# Patient Record
Sex: Female | Born: 1985 | Hispanic: No | Marital: Single | Smoking: Current every day smoker
Health system: Southern US, Community
[De-identification: ages and names within clinical notes are randomized; demographics above are authoritative.]

## PROBLEM LIST (undated history)

## (undated) DIAGNOSIS — J45909 Unspecified asthma, uncomplicated: Secondary | ICD-10-CM

## (undated) DIAGNOSIS — I8390 Asymptomatic varicose veins of unspecified lower extremity: Secondary | ICD-10-CM

## (undated) HISTORY — PX: BACK SURGERY: SHX140

## (undated) HISTORY — DX: Asymptomatic varicose veins of unspecified lower extremity: I83.90

## (undated) HISTORY — PX: OTHER SURGICAL HISTORY: SHX169

---
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
## 2007-12-22 ENCOUNTER — Inpatient Hospital Stay (HOSPITAL_COMMUNITY): Admission: AD | Admit: 2007-12-22 | Discharge: 2007-12-22 | Payer: Self-pay | Admitting: Obstetrics & Gynecology

## 2008-07-25 ENCOUNTER — Emergency Department (HOSPITAL_BASED_OUTPATIENT_CLINIC_OR_DEPARTMENT_OTHER): Admission: EM | Admit: 2008-07-25 | Discharge: 2008-07-25 | Payer: Self-pay | Admitting: Emergency Medicine

## 2009-08-10 ENCOUNTER — Emergency Department (HOSPITAL_BASED_OUTPATIENT_CLINIC_OR_DEPARTMENT_OTHER): Admission: EM | Admit: 2009-08-10 | Discharge: 2009-08-11 | Payer: Self-pay | Admitting: Emergency Medicine

## 2009-08-13 ENCOUNTER — Emergency Department (HOSPITAL_BASED_OUTPATIENT_CLINIC_OR_DEPARTMENT_OTHER): Admission: EM | Admit: 2009-08-13 | Discharge: 2009-08-13 | Payer: Self-pay | Admitting: Emergency Medicine

## 2010-03-03 ENCOUNTER — Emergency Department (HOSPITAL_BASED_OUTPATIENT_CLINIC_OR_DEPARTMENT_OTHER): Admission: EM | Admit: 2010-03-03 | Discharge: 2010-03-04 | Payer: Self-pay | Admitting: Emergency Medicine

## 2010-10-22 ENCOUNTER — Emergency Department (INDEPENDENT_AMBULATORY_CARE_PROVIDER_SITE_OTHER)
Admission: EM | Admit: 2010-10-22 | Discharge: 2010-10-22 | Payer: Self-pay | Source: Home / Self Care | Admitting: Emergency Medicine

## 2010-10-22 DIAGNOSIS — X500XXA Overexertion from strenuous movement or load, initial encounter: Secondary | ICD-10-CM

## 2010-10-22 DIAGNOSIS — M25519 Pain in unspecified shoulder: Secondary | ICD-10-CM

## 2011-05-13 ENCOUNTER — Emergency Department (HOSPITAL_BASED_OUTPATIENT_CLINIC_OR_DEPARTMENT_OTHER)
Admission: EM | Admit: 2011-05-13 | Discharge: 2011-05-13 | Discharge status: Against Medical Advice | Payer: Self-pay | Attending: Emergency Medicine | Admitting: Emergency Medicine

## 2011-05-13 ENCOUNTER — Emergency Department (INDEPENDENT_AMBULATORY_CARE_PROVIDER_SITE_OTHER): Payer: Self-pay

## 2011-05-13 ENCOUNTER — Encounter: Payer: Self-pay | Admitting: *Deleted

## 2011-05-13 DIAGNOSIS — Y9239 Other specified sports and athletic area as the place of occurrence of the external cause: Secondary | ICD-10-CM | POA: Insufficient documentation

## 2011-05-13 DIAGNOSIS — S62639A Displaced fracture of distal phalanx of unspecified finger, initial encounter for closed fracture: Secondary | ICD-10-CM | POA: Insufficient documentation

## 2011-05-13 DIAGNOSIS — X58XXXA Exposure to other specified factors, initial encounter: Secondary | ICD-10-CM | POA: Insufficient documentation

## 2011-05-13 DIAGNOSIS — B081 Molluscum contagiosum: Secondary | ICD-10-CM

## 2011-05-13 DIAGNOSIS — Y9361 Activity, american tackle football: Secondary | ICD-10-CM | POA: Insufficient documentation

## 2011-05-13 MED ORDER — HYDROCODONE-ACETAMINOPHEN 5-500 MG PO TABS: 1.0000 | ORAL_TABLET | Freq: Four times a day (QID) | ORAL | Status: AC | PRN

## 2011-05-13 NOTE — ED Provider Notes (Signed)
Doug Sou, MD Medical screening examination/treatment/procedure(s) were conducted as a shared visit with non-physician practitioner(s) and myself.  I personally evaluated the patient during the encounter  Doug Sou, MD 05/13/11 1439

## 2011-05-13 NOTE — ED Provider Notes (Signed)
History     CSN: [id number] Arrival date & time: 05/13/2011  1:19 PM  Chief Complaint  Patient presents with  . Hand Pain   HPI Comments: Pt states that she was playing football yesterday and that is when the pain started:pt states that she has also noted an bump to her vaginal area about 2 weeks ago that she wants to have looked at:pt state that there is no pain associated with and there has not been any drainage  Patient is a 25 y.o. female presenting with hand pain. The history is provided by the patient. No language interpreter was used.  Hand Pain This is a new problem. The current episode started yesterday. The problem occurs constantly. The problem has been unchanged. The symptoms are aggravated by bending. She has tried nothing for the symptoms.    History reviewed. No pertinent past medical history.  No past surgical history on file.  No family history on file.  History  Substance Use Topics  . Smoking status: Former Smoker -- 0.5 packs/day for 5 years  . Smokeless tobacco: Current User  . Alcohol Use: Yes    OB History    Grav Para Term Preterm Abortions TAB SAB Ect Mult Living                  Review of Systems  All other systems reviewed and are negative.    Physical Exam  BP 113/63  Pulse 75  Temp(Src) 98 F (36.7 C) (Oral)  Resp 18  Ht 5\' 9"  (1.753 m)  Wt 215 lb (97.523 kg)  BMI 31.75 kg/m2  SpO2 100%  LMP 04/27/2011  Physical Exam  Nursing note and vitals reviewed. Constitutional: She appears well-developed and well-nourished.  HENT:  Head: Normocephalic.  Eyes: Pupils are equal, round, and reactive to light.  Neck: Normal range of motion.  Cardiovascular: Normal rate and regular rhythm.   Pulmonary/Chest: Effort normal and breath sounds normal.  Genitourinary:       Pt has a small raised area to the right labia that has a cauliflower appearance to the area  Musculoskeletal: Normal range of motion.  Neurological: She is alert.  Skin:     Pt has mild redness noted to the distal aspect of the right middle finger:pt has full rom:pt is tender from along the middle and distal phalanx  Psychiatric: She has a normal mood and affect.    ED Course  Procedures Dg Finger Middle Right  05/13/2011  *RADIOLOGY REPORT*  Clinical Data: Injured right long finger.  Pain and swelling distally.  RIGHT MIDDLE FINGER 2+V 05/13/2011:  Comparison: None.  Findings: Mildly displaced fracture involving the base of the distal phalanx, extending to the articular surface.  No other fractures.  Well-preserved joint spaces.  Well-preserved bone mineral density.  IMPRESSION: Intra-articular mildly displaced fracture involving the base of the distal phalanx.  Original Report Authenticated By: Arnell Sieving, M.D.    MDM Vaginal bump consistent with vaginal warts:pt finger splinted by nursing staff:pt okay to go home with something for pain and follow up with gyn and ortho      Teressa Lower, NP 05/13/11 1430

## 2011-05-13 NOTE — ED Notes (Signed)
Pt. Has redness at the fingernail bed on the R Middle finger and reports a white spot on her R labia she wants checked.  NO drainage from either site.

## 2011-06-05 NOTE — ED Provider Notes (Signed)
Medical screening examination/treatment/procedure(s) were performed by non-physician practitioner and as supervising physician I was immediately available for consultation/collaboration.  Doug Sou, MD 06/05/11 1620

## 2011-06-20 LAB — URINALYSIS, ROUTINE W REFLEX MICROSCOPIC
Glucose, UA: NEGATIVE
Hgb urine dipstick: NEGATIVE
Ketones, ur: NEGATIVE
Nitrite: NEGATIVE
pH: 7.5

## 2011-06-20 LAB — URINE MICROSCOPIC-ADD ON

## 2011-06-20 LAB — POCT PREGNANCY, URINE
Operator id: 263291
Preg Test, Ur: POSITIVE

## 2011-06-26 ENCOUNTER — Emergency Department (HOSPITAL_COMMUNITY)
Admission: EM | Admit: 2011-06-26 | Discharge: 2011-06-26 | Disposition: A | Discharge status: Home / Self Care | Payer: Self-pay | Attending: Emergency Medicine | Admitting: Emergency Medicine

## 2011-06-26 ENCOUNTER — Emergency Department (HOSPITAL_COMMUNITY): Payer: Self-pay

## 2011-06-26 DIAGNOSIS — R079 Chest pain, unspecified: Secondary | ICD-10-CM | POA: Insufficient documentation

## 2011-06-26 DIAGNOSIS — R05 Cough: Secondary | ICD-10-CM | POA: Insufficient documentation

## 2011-06-26 DIAGNOSIS — R059 Cough, unspecified: Secondary | ICD-10-CM | POA: Insufficient documentation

## 2011-06-26 DIAGNOSIS — R51 Headache: Secondary | ICD-10-CM | POA: Insufficient documentation

## 2011-06-26 DIAGNOSIS — R209 Unspecified disturbances of skin sensation: Secondary | ICD-10-CM | POA: Insufficient documentation

## 2011-06-26 DIAGNOSIS — R61 Generalized hyperhidrosis: Secondary | ICD-10-CM | POA: Insufficient documentation

## 2011-06-26 LAB — CBC
MCH: 33.1 pg (ref 26.0–34.0)
MCHC: 34.8 g/dL (ref 30.0–36.0)
RDW: 13.6 % (ref 11.5–15.5)

## 2011-06-26 LAB — URINALYSIS, ROUTINE W REFLEX MICROSCOPIC
Glucose, UA: NEGATIVE mg/dL
Hgb urine dipstick: NEGATIVE
Protein, ur: NEGATIVE mg/dL
pH: 7 (ref 5.0–8.0)

## 2011-06-26 LAB — CK TOTAL AND CKMB (NOT AT ARMC)
CK, MB: 2.5 ng/mL (ref 0.3–4.0)
Relative Index: 0.9 (ref 0.0–2.5)

## 2011-06-26 LAB — DIFFERENTIAL
Basophils Absolute: 0.1 10*3/uL (ref 0.0–0.1)
Basophils Relative: 1 % (ref 0–1)
Eosinophils Relative: 1 % (ref 0–5)
Monocytes Absolute: 1 10*3/uL (ref 0.1–1.0)
Monocytes Relative: 8 % (ref 3–12)

## 2011-06-26 LAB — BASIC METABOLIC PANEL
BUN: 7 mg/dL (ref 6–23)
Calcium: 9.7 mg/dL (ref 8.4–10.5)
GFR calc Af Amer: >60 mL/min (ref >60)
GFR calc non Af Amer: >60 mL/min (ref >60)
Potassium: 3.9 mEq/L (ref 3.5–5.1)
Sodium: 137 mEq/L (ref 135–145)

## 2011-06-26 LAB — POCT I-STAT TROPONIN I: Troponin i, poc: 0 ng/mL (ref 0.00–0.08)

## 2011-06-26 LAB — URINE MICROSCOPIC-ADD ON

## 2012-03-29 ENCOUNTER — Emergency Department (HOSPITAL_BASED_OUTPATIENT_CLINIC_OR_DEPARTMENT_OTHER)
Admission: EM | Admit: 2012-03-29 | Discharge: 2012-03-29 | Disposition: A | Discharge status: Home / Self Care | Payer: Medicaid Other | Attending: Emergency Medicine | Admitting: Emergency Medicine

## 2012-03-29 ENCOUNTER — Encounter (HOSPITAL_BASED_OUTPATIENT_CLINIC_OR_DEPARTMENT_OTHER): Payer: Self-pay | Admitting: *Deleted

## 2012-03-29 ENCOUNTER — Emergency Department (HOSPITAL_BASED_OUTPATIENT_CLINIC_OR_DEPARTMENT_OTHER): Payer: Medicaid Other

## 2012-03-29 DIAGNOSIS — S63501A Unspecified sprain of right wrist, initial encounter: Secondary | ICD-10-CM

## 2012-03-29 DIAGNOSIS — S63509A Unspecified sprain of unspecified wrist, initial encounter: Secondary | ICD-10-CM | POA: Insufficient documentation

## 2012-03-29 DIAGNOSIS — Y92009 Unspecified place in unspecified non-institutional (private) residence as the place of occurrence of the external cause: Secondary | ICD-10-CM | POA: Insufficient documentation

## 2012-03-29 DIAGNOSIS — J45909 Unspecified asthma, uncomplicated: Secondary | ICD-10-CM | POA: Insufficient documentation

## 2012-03-29 DIAGNOSIS — S60229A Contusion of unspecified hand, initial encounter: Secondary | ICD-10-CM | POA: Insufficient documentation

## 2012-03-29 DIAGNOSIS — S60221A Contusion of right hand, initial encounter: Secondary | ICD-10-CM

## 2012-03-29 DIAGNOSIS — Z87891 Personal history of nicotine dependence: Secondary | ICD-10-CM | POA: Insufficient documentation

## 2012-03-29 HISTORY — DX: Unspecified asthma, uncomplicated: J45.909

## 2012-03-29 MED ORDER — HYDROCODONE-ACETAMINOPHEN 5-325 MG PO TABS: 2.0000 | ORAL_TABLET | ORAL | Status: AC | PRN
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
## 2012-03-29 NOTE — ED Notes (Signed)
Chart reviewed and care assumed. 

## 2012-03-30 NOTE — ED Provider Notes (Signed)
Medical screening examination/treatment/procedure(s) were performed by non-physician practitioner and as supervising physician I was immediately available for consultation/collaboration.    Joya Gaskins, MD 03/30/12 [phone]

## 2012-04-10 ENCOUNTER — Encounter: Payer: Self-pay | Admitting: Vascular Surgery

## 2012-04-11 ENCOUNTER — Encounter: Payer: Self-pay | Admitting: Vascular Surgery

## 2012-04-11 ENCOUNTER — Ambulatory Visit (INDEPENDENT_AMBULATORY_CARE_PROVIDER_SITE_OTHER): Payer: Medicaid Other | Admitting: *Deleted

## 2012-04-11 DIAGNOSIS — I8310 Varicose veins of unspecified lower extremity with inflammation: Secondary | ICD-10-CM

## 2012-04-12 ENCOUNTER — Encounter: Payer: Self-pay | Admitting: Vascular Surgery

## 2012-04-19 NOTE — Procedures (Unsigned)
LOWER EXTREMITY VENOUS REFLUX EXAM  INDICATION:  Right lower extremity varicose veins.  EXAM:  Using color-flow imaging and pulse Doppler spectral analysis, the right common femoral, femoral, popliteal, posterior tibial, great and small saphenous veins were evaluated.  There is evidence suggesting deep venous insufficiency in the right lower extremity.  The right saphenofemoral junction is not competent with Reflux of >534milliseconds.  The right great saphenous vein is competent.  The right proximal small saphenous vein demonstrates competency.  There is a right anterior saphenous branch that is tortuous and incompetent with Reflux of >547milliseconds.  GSV Diameter (used if found to be incompetent only)                                           Right    Left Proximal Greater Saphenous Vein           cm       cm Proximal-to-mid-thigh                     cm       cm Mid thigh                                 cm       cm Mid-distal thigh                          cm       cm Distal thigh                              cm       cm Knee                                      cm       cm  IMPRESSION: 1. The right great saphenous vein is competent. 2. The right great saphenous vein is not tortuous. 3. The deep venous system is not competent with Reflux of     >553milliseconds. 4. The right small saphenous vein is competent. 5. The right anterior saphenous vein is incompetent.  ___________________________________________ Di Kindle. Edilia Bo, M.D.  EM/MEDQ  D:  04/12/2012  T:  04/12/2012  Job:  [id number]

## 2012-05-07 ENCOUNTER — Encounter: Payer: Self-pay | Admitting: Vascular Surgery

## 2012-05-08 ENCOUNTER — Encounter: Payer: Self-pay | Admitting: Vascular Surgery

## 2013-02-04 ENCOUNTER — Encounter: Payer: Self-pay | Admitting: Vascular Surgery

## 2013-02-05 ENCOUNTER — Ambulatory Visit (INDEPENDENT_AMBULATORY_CARE_PROVIDER_SITE_OTHER): Payer: Medicaid Other | Admitting: Vascular Surgery

## 2013-02-05 ENCOUNTER — Encounter: Payer: Self-pay | Admitting: Vascular Surgery

## 2013-02-05 VITALS — BP 138/72 | HR 69 | Resp 16 | Ht 69.0 in | Wt 248.0 lb

## 2013-02-05 DIAGNOSIS — I83891 Varicose veins of right lower extremities with other complications: Secondary | ICD-10-CM | POA: Insufficient documentation

## 2013-02-05 DIAGNOSIS — I83893 Varicose veins of bilateral lower extremities with other complications: Secondary | ICD-10-CM

## 2013-02-05 NOTE — Progress Notes (Signed)
Subjective:     Patient ID: Kelsey Hartman, female   DOB: May 07, 1986, 27 y.o.   MRN: [id number]  HPI this 27 year old female was evaluated for severe varicose veins of the right leg. She has had varicosities for the past 5 years. They have become more prominent and more painful. She has a burning sensation in her anterior thigh down the knee area and into the pretibial region which is almost constant. She also has aching throbbing and burning discomfort as the day progresses. She works at home [employer] and is on her feet most of the day. She does not wear elastic compression stockings were elevated her legs on a regular basis. She has no history of DVT, thrombophlebitis, stasis ulcers, bleeding, or other complications. The symptoms have become significantly worse.  Past Medical History  Diagnosis Date  . Asthma   . Varicose veins     History  Substance Use Topics  . Smoking status: Current Every Day Smoker -- 0.50 packs/day for 7 years    Types: Cigarettes  . Smokeless tobacco: Never Used  . Alcohol Use: Yes    Family History  Problem Relation Age of Onset  . Hyperlipidemia Father     No Known Allergies  Current outpatient prescriptions:albuterol (PROVENTIL) (5 MG/ML) 0.5% nebulizer solution, Take 2.5 mg by nebulization every 6 (six) hours as needed.  , Disp: , Rfl:   BP 138/72  Pulse 69  Resp 16  Ht 5\' 9"  (1.753 m)  Wt 248 lb (112.492 kg)  BMI 36.61 kg/m2  Body mass index is 36.61 kg/(m^2).           Review of Systems denies chest pain, dyspnea on exertion, PND, orthopnea. Does complain of asthma and numbness in her arms or legs. Other systems negative and a complete review of systems     Objective:   Physical Exam blood pressure 130/72 heart rate 69 respirations 16 Gen.-alert and oriented x3 in no apparent distress HEENT normal for age Lungs no rhonchi or wheezing Cardiovascular regular rhythm no murmurs carotid pulses 3+ palpable no bruits audible Abdomen soft  nontender no palpable masses Musculoskeletal free of  major deformities Skin clear -no rashes Neurologic normal Lower extremities 3+ femoral and dorsalis pedis pulses palpable bilaterally with no edema on the left 1+ edema on the right Bulging varicosities beginning in the proximal third of the anterior thigh on the right extending down the anterior thigh and lateral to the knee into the lateral calf region. There were no active ulcers or hyperpigmentation noted.  Patient had a reflux venous study performed in July of 2013 in our office which I have reviewed. She does have gross reflux in the anterior accessory branch of the right great saphenous vein supplying these bulging varicosities. Today I confirmed those findings with the bedside sono site exam. The right great saphenous vein also appears large and may have some reflux as well.       Assessment:     Bulging symptomatic varicosities right leg secondary to reflux anterior accessory branch right great saphenous vein-possible reflux right great saphenous vein main trunk. These are causing pain and swelling affecting patient's daily living.    Plan:     #1 long-leg elastic compression stockings 20-30 mm gradient #2 elevate legs during the day as much as work will allow #3 ibuprofen on a daily basis #4 return in 3 months. We'll obtain a new reflux exam of the right leg to determine exactly the status of the anterior accessory branch  and the right great saphenous vein main trunk.  If not significantly improved patient will need laser ablation of anterior accessory branch with greater than 20 stab phlebectomy-possible laser ablation right great saphenous vein depending on results of reflux study

## 2013-02-12 ENCOUNTER — Other Ambulatory Visit: Payer: Self-pay | Admitting: *Deleted

## 2013-02-12 DIAGNOSIS — I83893 Varicose veins of bilateral lower extremities with other complications: Secondary | ICD-10-CM

## 2013-03-22 ENCOUNTER — Emergency Department (HOSPITAL_BASED_OUTPATIENT_CLINIC_OR_DEPARTMENT_OTHER)
Admission: EM | Admit: 2013-03-22 | Discharge: 2013-03-22 | Disposition: A | Discharge status: Home / Self Care | Payer: Medicaid Other | Attending: Emergency Medicine | Admitting: Emergency Medicine

## 2013-03-22 ENCOUNTER — Encounter (HOSPITAL_BASED_OUTPATIENT_CLINIC_OR_DEPARTMENT_OTHER): Payer: Self-pay | Admitting: *Deleted

## 2013-03-22 DIAGNOSIS — T2121XA Burn of second degree of chest wall, initial encounter: Secondary | ICD-10-CM | POA: Insufficient documentation

## 2013-03-22 DIAGNOSIS — T310 Burns involving less than 10% of body surface: Secondary | ICD-10-CM

## 2013-03-22 DIAGNOSIS — T2220XA Burn of second degree of shoulder and upper limb, except wrist and hand, unspecified site, initial encounter: Secondary | ICD-10-CM | POA: Insufficient documentation

## 2013-03-22 DIAGNOSIS — F17200 Nicotine dependence, unspecified, uncomplicated: Secondary | ICD-10-CM | POA: Insufficient documentation

## 2013-03-22 DIAGNOSIS — J45909 Unspecified asthma, uncomplicated: Secondary | ICD-10-CM | POA: Insufficient documentation

## 2013-03-22 DIAGNOSIS — T2027XA Burn of second degree of neck, initial encounter: Secondary | ICD-10-CM | POA: Insufficient documentation

## 2013-03-22 DIAGNOSIS — T2122XA Burn of second degree of abdominal wall, initial encounter: Secondary | ICD-10-CM | POA: Insufficient documentation

## 2013-03-22 DIAGNOSIS — T23229A Burn of second degree of unspecified single finger (nail) except thumb, initial encounter: Secondary | ICD-10-CM | POA: Insufficient documentation

## 2013-03-22 DIAGNOSIS — Z8679 Personal history of other diseases of the circulatory system: Secondary | ICD-10-CM | POA: Insufficient documentation

## 2013-03-22 DIAGNOSIS — Z23 Encounter for immunization: Secondary | ICD-10-CM | POA: Insufficient documentation

## 2013-03-22 DIAGNOSIS — Z79899 Other long term (current) drug therapy: Secondary | ICD-10-CM | POA: Insufficient documentation

## 2013-03-22 MED ORDER — NAPROXEN 500 MG PO TABS: 500.0000 mg | ORAL_TABLET | Freq: Two times a day (BID) | ORAL | Status: DC

## 2013-03-22 MED ORDER — TETANUS-DIPHTH-ACELL PERTUSSIS 5-2.5-18.5 LF-MCG/0.5 IM SUSP
0.5000 mL | Freq: Once | INTRAMUSCULAR | Status: AC
  Administered 2013-03-22: 0.5 mL via INTRAMUSCULAR
  Filled 2013-03-22 (×2): qty 0.5

## 2013-03-22 MED ORDER — HYDROCODONE-ACETAMINOPHEN 5-325 MG PO TABS: 1.0000 | ORAL_TABLET | Freq: Four times a day (QID) | ORAL | Status: DC | PRN
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
## 2013-04-26 ENCOUNTER — Encounter (HOSPITAL_BASED_OUTPATIENT_CLINIC_OR_DEPARTMENT_OTHER): Payer: Self-pay | Admitting: Family Medicine

## 2013-04-26 ENCOUNTER — Emergency Department (HOSPITAL_BASED_OUTPATIENT_CLINIC_OR_DEPARTMENT_OTHER): Payer: Medicaid Other

## 2013-04-26 ENCOUNTER — Emergency Department (HOSPITAL_BASED_OUTPATIENT_CLINIC_OR_DEPARTMENT_OTHER)
Admission: EM | Admit: 2013-04-26 | Discharge: 2013-04-26 | Disposition: A | Discharge status: Home / Self Care | Payer: Medicaid Other | Attending: Emergency Medicine | Admitting: Emergency Medicine

## 2013-04-26 DIAGNOSIS — J45909 Unspecified asthma, uncomplicated: Secondary | ICD-10-CM | POA: Insufficient documentation

## 2013-04-26 DIAGNOSIS — S161XXA Strain of muscle, fascia and tendon at neck level, initial encounter: Secondary | ICD-10-CM

## 2013-04-26 DIAGNOSIS — IMO0002 Reserved for concepts with insufficient information to code with codable children: Secondary | ICD-10-CM | POA: Insufficient documentation

## 2013-04-26 DIAGNOSIS — S46911A Strain of unspecified muscle, fascia and tendon at shoulder and upper arm level, right arm, initial encounter: Secondary | ICD-10-CM

## 2013-04-26 DIAGNOSIS — Z79899 Other long term (current) drug therapy: Secondary | ICD-10-CM | POA: Insufficient documentation

## 2013-04-26 DIAGNOSIS — S139XXA Sprain of joints and ligaments of unspecified parts of neck, initial encounter: Secondary | ICD-10-CM | POA: Insufficient documentation

## 2013-04-26 DIAGNOSIS — Z8679 Personal history of other diseases of the circulatory system: Secondary | ICD-10-CM | POA: Insufficient documentation

## 2013-04-26 DIAGNOSIS — F17200 Nicotine dependence, unspecified, uncomplicated: Secondary | ICD-10-CM | POA: Insufficient documentation

## 2013-04-26 MED ORDER — IBUPROFEN 800 MG PO TABS: 800.0000 mg | ORAL_TABLET | Freq: Three times a day (TID) | ORAL | Status: DC

## 2013-04-26 MED ORDER — HYDROCODONE-ACETAMINOPHEN 5-325 MG PO TABS: 2.0000 | ORAL_TABLET | ORAL | Status: DC | PRN
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
## 2013-04-26 NOTE — ED Provider Notes (Signed)
Medical screening examination/treatment/procedure(s) were performed by non-physician practitioner and as supervising physician I was immediately available for consultation/collaboration.   Saharah Sherrow Y. Kaydie Petsch, MD 04/26/13 2002 
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
## 2013-05-13 ENCOUNTER — Encounter: Payer: Self-pay | Admitting: Vascular Surgery

## 2013-05-14 ENCOUNTER — Ambulatory Visit: Payer: Medicaid Other | Admitting: Vascular Surgery

## 2013-06-10 ENCOUNTER — Encounter: Payer: Self-pay | Admitting: Vascular Surgery

## 2013-06-11 ENCOUNTER — Ambulatory Visit (INDEPENDENT_AMBULATORY_CARE_PROVIDER_SITE_OTHER): Payer: Medicaid Other | Admitting: Vascular Surgery

## 2013-06-11 ENCOUNTER — Encounter (INDEPENDENT_AMBULATORY_CARE_PROVIDER_SITE_OTHER): Payer: Medicaid Other | Admitting: *Deleted

## 2013-06-11 ENCOUNTER — Encounter: Payer: Self-pay | Admitting: Vascular Surgery

## 2013-06-11 VITALS — BP 119/70 | HR 70 | Resp 16 | Ht 68.0 in | Wt 245.0 lb

## 2013-06-11 DIAGNOSIS — I83893 Varicose veins of bilateral lower extremities with other complications: Secondary | ICD-10-CM

## 2013-06-11 NOTE — Progress Notes (Signed)
Subjective:     Patient ID: Kelsey Hartman, female   DOB: 1986/04/07, 27 y.o.   MRN: [id number]  HPI this 27 year old female returns for further evaluation of her severe venous insufficiency of the right leg. She has been shown to have severe reflux in the right greater saphenous vein-anterior accessor branch which supplies large bulging varicosities which are painful with aching throbbing and burning discomfort. Long-leg elastic compression stockings 20-30 mm gradient as well as elevation and ibuprofen have not improved her symptoms. She does have swelling which progresses as the day develops. She has no history of DVT.  Past Medical History  Diagnosis Date  . Asthma   . Varicose veins     History  Substance Use Topics  . Smoking status: Current Every Day Smoker -- 0.50 packs/day for 7 years    Types: Cigarettes  . Smokeless tobacco: Never Used  . Alcohol Use: Yes    Family History  Problem Relation Age of Onset  . Hyperlipidemia Father     No Known Allergies  Current outpatient prescriptions:albuterol (PROVENTIL) (5 MG/ML) 0.5% nebulizer solution, Take 2.5 mg by nebulization every 6 (six) hours as needed.  , Disp: , Rfl: ;  HYDROcodone-acetaminophen (NORCO/VICODIN) 5-325 MG per tablet, Take 1-2 tablets by mouth every 6 (six) hours as needed for pain., Disp: 15 tablet, Rfl: 0 HYDROcodone-acetaminophen (NORCO/VICODIN) 5-325 MG per tablet, Take 2 tablets by mouth every 4 (four) hours as needed., Disp: 16 tablet, Rfl: 0;  ibuprofen (ADVIL,MOTRIN) 800 MG tablet, Take 1 tablet (800 mg total) by mouth 3 (three) times daily., Disp: 21 tablet, Rfl: 0;  levonorgestrel (MIRENA) 20 MCG/24HR IUD, 1 each by Intrauterine route once., Disp: , Rfl:  naproxen (NAPROSYN) 500 MG tablet, Take 1 tablet (500 mg total) by mouth 2 (two) times daily., Disp: 14 tablet, Rfl: 0  BP 119/70  Pulse 70  Resp 16  Ht 5\' 8"  (1.727 m)  Wt 245 lb (111.131 kg)  BMI 37.26 kg/m2  Body mass index is 37.26  kg/(m^2).       Review of Systems denies chest pain, dyspnea on exertion, hemoptysis    Objective:   Physical Exam BP 119/70  Pulse 70  Resp 16  Ht 5\' 8"  (1.727 m)  Wt 245 lb (111.131 kg)  BMI 37.26 kg/m2  General well-developed well-nourished female in no apparent stress alert and oriented x3 Right lower extremity with large bulging varicosities beginning in the proximal third of the anterior thigh extending anteriorly distally across the knee into the lateral calf with smaller varicosities in the lateral calf area down to the ankle. 1+ edema is present.  Repeat venous duplex exam today reveals gross reflux in the anterior accessory branch and the great saphenous vein on the right supplying these bulging varicosities but no DVT     Assessment:     Severe venous insufficiency right leg with painful varicosities supplied by gross reflux in the right great saphenous system-primarily the anterior accessory branch Her symptoms are resistant to conservative measures and affecting her daily living    Plan:     Patient needs a laser ablation right great saphenous vein with greater than 20 stab phlebectomy to be performed as single procedure we'll schedule this in the near future

## 2013-06-14 ENCOUNTER — Encounter: Payer: Self-pay | Admitting: Vascular Surgery

## 2013-06-17 ENCOUNTER — Ambulatory Visit (INDEPENDENT_AMBULATORY_CARE_PROVIDER_SITE_OTHER): Payer: Medicaid Other | Admitting: Vascular Surgery

## 2013-06-17 ENCOUNTER — Encounter: Payer: Self-pay | Admitting: Vascular Surgery

## 2013-06-17 VITALS — BP 137/77 | HR 65 | Resp 16 | Ht 68.0 in | Wt 245.0 lb

## 2013-06-17 DIAGNOSIS — I83893 Varicose veins of bilateral lower extremities with other complications: Secondary | ICD-10-CM

## 2013-06-17 NOTE — Progress Notes (Signed)
Laser Ablation Procedure      Date: 06/17/2013    Kelsey Hartman DOB:25-Aug-1986  Consent signed: Yes  Surgeon:J.D. Hart Rochester  Procedure: Laser Ablation: right Greater Saphenous Vein  BP 137/77  Pulse 65  Resp 16  Ht 5\' 8"  (1.727 m)  Wt 245 lb (111.131 kg)  BMI 37.26 kg/m2  Start time: 1:00   End time: 2:20  Tumescent Anesthesia: 200 cc 0.9% NaCl with 50 cc Lidocaine HCL with 1% Epi and 15 cc 8.4% NaHCO3  Local Anesthesia: 11 cc Lidocaine HCL and NaHCO3 (ratio 2:1)  Pulsed mode: 15 watts, delay, 1.0 duration Total energy: 598.28, total pulses: 40, total time: :40     Stab Phlebectomy: >20 Sites: Thigh and Calf  Patient tolerated procedure well: Yes  Notes:   Description of Procedure:  After marking the course of the saphenous vein and the secondary varicosities in the standing position, the patient was placed on the operating table in the supine position, and the right leg was prepped and draped in sterile fashion. Local anesthetic was administered, and under ultrasound guidance the saphenous vein was accessed with a micro needle and guide wire; then the micro puncture sheath was placed. A guide wire was inserted to the saphenofemoral junction, followed by a 5 french sheath.  The position of the sheath and then the laser fiber below the junction was confirmed using the ultrasound and visualization of the aiming beam.  Tumescent anesthesia was administered along the course of the saphenous vein using ultrasound guidance. Protective laser glasses were placed on the patient, and the laser was fired at 15 watts pulsed mode.  For a total of 598.28 joules.  A steri strip was applied to the puncture site.  The patient was then put into Trendelenburg position.  Local anesthetic was utilized overlying the marked varicosities.  Greater than 20 stab wounds were made using the tip of an 11 blade; and using the vein hook,  The phlebectomies were performed using a hemostat to avulse these  varicosities.  Adequate hemostasis was achieved, and steri strips were applied to the stab wound.      ABD pads and thigh high compression stockings were applied.  Ace wrap bandages were applied over the phlebectomy sites and at the top of the saphenofemoral junction.  Blood loss was less than 15 cc.  The patient ambulated out of the operating room having tolerated the procedure well.

## 2013-06-17 NOTE — Progress Notes (Signed)
Subjective:     Patient ID: Kelsey Hartman, female   DOB: [birth year], 27 y.o.   MRN: [id number]  HPI this 27 year old female laser ablation of the very proximal portion of the right great saphenous vein plus right and 20 stab phlebectomy of painful prominent varicosities in the anterior thigh and lateral calf area performed under local tumescent anesthesia. A total of 598 J of energy was utilized.   Review of Systems     Objective:   Physical Exam BP 137/77  Pulse 65  Resp 16  Ht 5\' 8"  (1.727 m)  Wt 245 lb (111.131 kg)  BMI 37.26 kg/m2       Assessment:     Well-tolerated laser ablation proximal right great saphenous vein with greater than 20 stab phlebectomy of painful varicosities performed under local tumescent anesthesia    Plan:     Return October 7 for venous duplex exam to confirm closure right great saphenous

## 2013-06-18 ENCOUNTER — Telehealth: Payer: Self-pay | Admitting: *Deleted

## 2013-06-18 NOTE — Telephone Encounter (Signed)
Pt doing well. Not in much pain. Asked if she could loosen the ace wraps a bit. Following all instructions.

## 2013-06-20 ENCOUNTER — Telehealth: Payer: Self-pay | Admitting: *Deleted

## 2013-06-20 NOTE — Telephone Encounter (Signed)
Kelsey Hartman asks if it is "mandatory" to wear compression hose.  Explained that she needs to wear the thigh high compression hose during the day (from when she awakens until she goes to bed) for two weeks following endovenous laser ablation and stab phlebectomy. (done on 06-17-2013 by Betti Cruz MD)  States she is having upper thigh pain in the leg that was treated and that her compression hose is not covering that area.  Encouraged her to pull the compression hose up to the top of her groin covering the painful area. Also recommended ice packs to affected area as needed.

## 2013-06-24 ENCOUNTER — Telehealth: Payer: Self-pay | Admitting: *Deleted

## 2013-06-24 NOTE — Telephone Encounter (Signed)
Kelsey Hartman is experiencing pain in her right inner thigh approximately midway between her groin and knee.  She says she has a hard lump the size of a marble that is tender to touch that has been present for about 4 days and she is concerned about a blood clot.  [name] states she does not have any other leg pain and denies swelling of right ankle or foot.  Encouraged [name] to continue using Ibuprofen (3 tablet 3 times daily with food), elevate her right leg frequently, continue wearing her thigh high compression hose daily, and use ice pack to affected area prn pain.  Reassured her that she was feeling discomfort in the area that had been treated with the laser and that swelling and tenderness over the treated site was within normal limits and a sign that the endovenous laser ablation was successful in closing the vein and that superficial clot in the treated vein was within normal limits.  [name] appears to be reassured.  Asked that she call [facility] if she has further questions or concerns, if the pain intensifies, or if she experiences pain in right foot or ankle.  She is scheduled for venous duplex and follow up appointment with Dr. Hart Rochester on 07-02-2013.

## 2013-06-27 ENCOUNTER — Other Ambulatory Visit: Payer: Self-pay | Admitting: Vascular Surgery

## 2013-06-27 DIAGNOSIS — I83893 Varicose veins of bilateral lower extremities with other complications: Secondary | ICD-10-CM

## 2013-06-27 DIAGNOSIS — Z48812 Encounter for surgical aftercare following surgery on the circulatory system: Secondary | ICD-10-CM

## 2013-07-01 ENCOUNTER — Encounter: Payer: Self-pay | Admitting: Vascular Surgery

## 2013-07-01 ENCOUNTER — Other Ambulatory Visit: Payer: Self-pay | Admitting: Vascular Surgery

## 2013-07-01 DIAGNOSIS — I83893 Varicose veins of bilateral lower extremities with other complications: Secondary | ICD-10-CM

## 2013-07-01 DIAGNOSIS — Z48812 Encounter for surgical aftercare following surgery on the circulatory system: Secondary | ICD-10-CM

## 2013-07-02 ENCOUNTER — Encounter (HOSPITAL_COMMUNITY): Payer: Medicaid Other

## 2013-07-02 ENCOUNTER — Ambulatory Visit: Payer: Medicaid Other | Admitting: Vascular Surgery

## 2013-07-02 ENCOUNTER — Inpatient Hospital Stay (HOSPITAL_COMMUNITY): Admission: RE | Admit: 2013-07-02 | Payer: Medicaid Other | Source: Ambulatory Visit

## 2013-07-03 ENCOUNTER — Encounter (INDEPENDENT_AMBULATORY_CARE_PROVIDER_SITE_OTHER): Payer: Self-pay

## 2013-07-03 DIAGNOSIS — I83893 Varicose veins of bilateral lower extremities with other complications: Secondary | ICD-10-CM

## 2014-09-12 ENCOUNTER — Telehealth: Payer: Self-pay | Admitting: *Deleted

## 2014-09-12 NOTE — Telephone Encounter (Signed)
Patient called stating that she is having pain in her right leg and the varicosities are present again and appear to be worse.  Her laser ablation was 09/14.  Patient is requesting to be seen again.  Scheduled appointment for ultrasound 09/23/14 and office visit with Dr Hart RochesterLawson 09/30/14. Patient was in agreement with the plan.

## 2014-09-17 ENCOUNTER — Other Ambulatory Visit: Payer: Self-pay

## 2014-09-17 DIAGNOSIS — M79606 Pain in leg, unspecified: Secondary | ICD-10-CM

## 2014-09-17 DIAGNOSIS — M79661 Pain in right lower leg: Secondary | ICD-10-CM

## 2014-09-17 DIAGNOSIS — I8390 Asymptomatic varicose veins of unspecified lower extremity: Secondary | ICD-10-CM

## 2014-09-23 ENCOUNTER — Encounter (HOSPITAL_COMMUNITY): Payer: Medicaid Other

## 2014-09-30 ENCOUNTER — Ambulatory Visit: Payer: Medicaid Other | Admitting: Vascular Surgery

## 2014-10-13 ENCOUNTER — Encounter: Payer: Self-pay | Admitting: Vascular Surgery

## 2014-10-14 ENCOUNTER — Encounter (HOSPITAL_COMMUNITY): Payer: Medicaid Other

## 2014-10-14 ENCOUNTER — Encounter: Payer: Medicaid Other | Admitting: Vascular Surgery

## 2014-10-20 NOTE — Progress Notes (Signed)
A user error has taken place: encounter opened in error, closed for administrative reasons.

## 2014-12-25 ENCOUNTER — Encounter (HOSPITAL_BASED_OUTPATIENT_CLINIC_OR_DEPARTMENT_OTHER): Payer: Self-pay

## 2014-12-25 ENCOUNTER — Emergency Department (HOSPITAL_BASED_OUTPATIENT_CLINIC_OR_DEPARTMENT_OTHER)
Admission: EM | Admit: 2014-12-25 | Discharge: 2014-12-25 | Disposition: A | Discharge status: Home / Self Care | Payer: Medicaid Other | Attending: Emergency Medicine | Admitting: Emergency Medicine

## 2014-12-25 ENCOUNTER — Emergency Department (HOSPITAL_BASED_OUTPATIENT_CLINIC_OR_DEPARTMENT_OTHER): Payer: Medicaid Other

## 2014-12-25 DIAGNOSIS — J45909 Unspecified asthma, uncomplicated: Secondary | ICD-10-CM | POA: Insufficient documentation

## 2014-12-25 DIAGNOSIS — O99330 Smoking (tobacco) complicating pregnancy, unspecified trimester: Secondary | ICD-10-CM | POA: Diagnosis not present

## 2014-12-25 DIAGNOSIS — F17219 Nicotine dependence, cigarettes, with unspecified nicotine-induced disorders: Secondary | ICD-10-CM | POA: Insufficient documentation

## 2014-12-25 DIAGNOSIS — Z3A00 Weeks of gestation of pregnancy not specified: Secondary | ICD-10-CM | POA: Insufficient documentation

## 2014-12-25 DIAGNOSIS — R109 Unspecified abdominal pain: Secondary | ICD-10-CM | POA: Diagnosis not present

## 2014-12-25 DIAGNOSIS — Z79899 Other long term (current) drug therapy: Secondary | ICD-10-CM | POA: Insufficient documentation

## 2014-12-25 DIAGNOSIS — O99519 Diseases of the respiratory system complicating pregnancy, unspecified trimester: Secondary | ICD-10-CM | POA: Diagnosis not present

## 2014-12-25 DIAGNOSIS — O200 Threatened abortion: Secondary | ICD-10-CM | POA: Diagnosis not present

## 2014-12-25 DIAGNOSIS — O9989 Other specified diseases and conditions complicating pregnancy, childbirth and the puerperium: Secondary | ICD-10-CM | POA: Diagnosis present

## 2014-12-25 LAB — URINALYSIS, ROUTINE W REFLEX MICROSCOPIC
Bilirubin Urine: NEGATIVE
Glucose, UA: NEGATIVE mg/dL
Hgb urine dipstick: NEGATIVE
Ketones, ur: NEGATIVE mg/dL
LEUKOCYTES UA: NEGATIVE
NITRITE: NEGATIVE
PH: 6 (ref 5.0–8.0)
Protein, ur: NEGATIVE mg/dL
SPECIFIC GRAVITY, URINE: 1.024 (ref 1.005–1.030)
Urobilinogen, UA: 0.2 mg/dL (ref 0.0–1.0)

## 2014-12-25 LAB — WET PREP, GENITAL
TRICH WET PREP: NONE SEEN
Yeast Wet Prep HPF POC: NONE SEEN

## 2014-12-25 LAB — PREGNANCY, URINE: Preg Test, Ur: POSITIVE — AB

## 2014-12-25 NOTE — ED Notes (Addendum)
C/o abd cramping-pt reports she is approx 2 months pregnant-pos home preg tests-estimate done by LMP by "nurse at OB/GYN" via phone-has not been seen by OB yet-called office and advised abd pain is normal rx phenergan was called in for pt

## 2014-12-25 NOTE — ED Notes (Signed)
Pt back from US, pending pelvic exam

## 2014-12-25 NOTE — ED Provider Notes (Signed)
CSN: [id number]     Arrival date & time 12/25/14  1233 History   First MD Initiated Contact with Patient 12/25/14 1257     Chief Complaint  Patient presents with  . Pregnant   . Abdominal Pain     (Consider location/radiation/quality/duration/timing/severity/associated sxs/prior Treatment) HPI  Pt states she is pregnant and having lower abdominal pain.  She states she took a home pregnancy test, she is unsure when her last period was- she had a short menses in late January and then some spotting in February.  She states she has been having some intermittent lower abdominal pain.  No continued vaginal bleeding.  No vomiting.  No fever/chills.  Denies dysuria.  Pt is G4P2, one miscarriage.  There are no other associated systemic symptoms, there are no other alleviating or modifying factors.   Past Medical History  Diagnosis Date  . Asthma   . Varicose veins    Past Surgical History  Procedure Laterality Date  . Uterine surergy at age 89     Family History  Problem Relation Age of Onset  . Hyperlipidemia Father    History  Substance Use Topics  . Smoking status: Current Every Day Smoker -- 0.50 packs/day for 7 years    Types: Cigarettes  . Smokeless tobacco: Never Used  . Alcohol Use: No   OB History    Gravida Para Term Preterm AB TAB SAB Ectopic Multiple Living   1              Review of Systems  ROS reviewed and all otherwise negative except for mentioned in HPI    Allergies  Review of patient's allergies indicates no known allergies.  Home Medications   Prior to Admission medications   Medication Sig Start Date End Date Taking? Authorizing Provider  Prenatal Vit-Fe Fumarate-FA (PRENATAL MULTIVITAMIN) TABS tablet Take 1 tablet by mouth daily at 12 noon.   Yes Historical Provider, MD  albuterol (PROVENTIL) (5 MG/ML) 0.5% nebulizer solution Take 2.5 mg by nebulization every 6 (six) hours as needed.      Historical Provider, MD  HYDROcodone-acetaminophen  (NORCO/VICODIN) 5-325 MG per tablet Take 2 tablets by mouth every 4 (four) hours as needed. 04/26/13   Elson AreasLeslie K Sofia, PA-C   BP 106/55 mmHg  Pulse 65  Temp(Src) 98.1 F (36.7 C) (Oral)  Resp 16  Ht 5\' 8"  (1.727 m)  Wt 264 lb (119.75 kg)  BMI 40.15 kg/m2  SpO2 97%  LMP  (LMP Unknown)  Vitals reviewed Physical Exam  Physical Examination: General appearance - alert, well appearing, and in no distress Mental status - alert, oriented to person, place, and time Eyes - no conjunctival injection, no scleral icterus Chest - clear to auscultation, no wheezes, rales or rhonchi, symmetric air entry Heart - normal rate, regular rhythm, normal S1, S2, no murmurs, rubs, clicks or gallops Abdomen - soft, nontender, nondistended, no masses or organomegaly Pelvic - normal external genitalia, vulva, vagina, cervix, uterus and adnexa, cervix closed Extremities - peripheral pulses normal, no pedal edema, no clubbing or cyanosis Skin - normal coloration and turgor, no rashes  ED Course  Procedures (including critical care time) Labs Review Labs Reviewed  WET PREP, GENITAL - Abnormal; Notable for the following:    Clue Cells Wet Prep HPF POC MODERATE (*)    WBC, Wet Prep HPF POC MODERATE (*)    All other components within normal limits  URINALYSIS, ROUTINE W REFLEX MICROSCOPIC - Abnormal; Notable for the following:  Color, Urine AMBER (*)    APPearance CLOUDY (*)    All other components within normal limits  PREGNANCY, URINE - Abnormal; Notable for the following:    Preg Test, Ur POSITIVE (*)    All other components within normal limits  GC/CHLAMYDIA PROBE AMP ([place])    Imaging Review US Ob Comp Less 14 Wks  12/25/2014   CLINICAL DATA:  29 year old female with pelvic pain for 1.5 weeks. Initial encounter.  EXAM: OBSTETRIC <14 WK [place] AND TRANSVAGINAL OB US  TECHNIQUE: Both transabdominal and transvaginal ultrasound examinations were performed for complete evaluation of the gestation as  well as the maternal uterus, adnexal regions, and pelvic cul-de-sac. Transvaginal technique was performed to assess early pregnancy.  COMPARISON:  Pelvis ultrasound 10/16/2014.  FINDINGS: Intrauterine gestational sac: Single  Yolk sac:  Visible  Embryo:  Visible  Cardiac Activity: Detected  Heart Rate: 96  bpm  CRL:  3.8  mm   6 w   0 d  Maternal uterus/adnexae: Questionable small volume of subchorionic hemorrhage. Trace simple appearing pelvic free fluid. Both ovaries appear normal, with small follicles.  IMPRESSION: Single living IUP demonstrated. Questionable small volume of subchorionic hemorrhage, otherwise no acute maternal findings visualized.   Electronically Signed   By: Odessa Fleming M.D.   On: 12/25/2014 14:24   US Ob Transvaginal  12/25/2014   CLINICAL DATA:  29 year old female with pelvic pain for 1.5 weeks. Initial encounter.  EXAM: OBSTETRIC <14 WK [place] AND TRANSVAGINAL OB US  TECHNIQUE: Both transabdominal and transvaginal ultrasound examinations were performed for complete evaluation of the gestation as well as the maternal uterus, adnexal regions, and pelvic cul-de-sac. Transvaginal technique was performed to assess early pregnancy.  COMPARISON:  Pelvis ultrasound 10/16/2014.  FINDINGS: Intrauterine gestational sac: Single  Yolk sac:  Visible  Embryo:  Visible  Cardiac Activity: Detected  Heart Rate: 96  bpm  CRL:  3.8  mm   6 w   0 d  Maternal uterus/adnexae: Questionable small volume of subchorionic hemorrhage. Trace simple appearing pelvic free fluid. Both ovaries appear normal, with small follicles.  IMPRESSION: Single living IUP demonstrated. Questionable small volume of subchorionic hemorrhage, otherwise no acute maternal findings visualized.   Electronically Signed   By: Odessa Fleming M.D.   On: 12/25/2014 14:24     EKG Interpretation None      MDM   Final diagnoses:  Abdominal pain  Threatened miscarriage    Pt presenting with lower abodmianl pain, she is in early pregnancy, pelvic exam  is reassuring.  Ultrasound shows + IUP at 6 weeks.  Pt has OB that she will followup with.  Discharged with strict return precautions.  Pt agreeable with plan.    Jerelyn Scott, MD 12/26/14 1710

## 2014-12-25 NOTE — Discharge Instructions (Signed)
Return to the ED with any concerns including vaginal bleeding and soaking more than one pad per hour, vomiting and not able to keep down liquids, fainting, decreased level of alertness/lethargy, or any other alarming symptoms

## 2014-12-25 NOTE — ED Notes (Signed)
Pt states she took a hpt in late feb and was positive, called her ob and made apt for 4/8. Pt reports sharp lower abd pain x 1 1/2 weeks off and on. Pt states she called her ob and was told it was normal, pt does not believe this. Pt states she also feels bloated.

## 2014-12-26 LAB — GC/CHLAMYDIA PROBE AMP (~~LOC~~) NOT AT ARMC
Chlamydia: NEGATIVE
Neisseria Gonorrhea: NEGATIVE

## 2015-01-08 ENCOUNTER — Telehealth: Payer: Self-pay

## 2015-01-08 NOTE — Telephone Encounter (Signed)
Phone call from pt.  Reported she has pain and swelling in the right leg from foot to above knee, up to approx. mid-thigh.  Stated she has been wearing compression stockings during the day.  Reported there are bulging veins in the calf, knee and thigh region, and that it is worse than it was, prior to her laser treatment.  Stated she has been applying ice, and elevating at intervals, and that the symptoms start to improve, then reoccur.  Denies any redness of the leg.  Stated the varicosities are tender.  Denies any ulcer/ open wounds.  Reported she is [redacted] weeks pregnant.  Requesting appt. For further eval.  Advised to continue to wear the compression hose, daily, while she is awake, and to elevate her legs, above level of the heart, 3-4 times/ day.  Advised will make Dr. Hart RochesterLawson aware on Monday, 4/18 for recommendations, since she is pregnant.  Pt. verb. understanding and agrees.

## 2015-01-08 NOTE — Telephone Encounter (Signed)
-----   Message from Fredrich Birksana P Millikan sent at 01/08/2015 12:01 PM EDT ----- Regarding: Questions about pregnancy and treatment  This patient had previously spoken with Kelsey Hartman in Jan 2016 to report c/o pain and swelling in LE, hx of ablation. Kelsey Hartman and I added Ms Kelsey Hartman on for LE reflux and to see JDL. She missed that appointment and called back today to reschedule her missed Jan appt for 05/10.   She would like a call back at [phone]. She is pregnant and wants to know if that would "mess up" her ultrasound results. She said that she had left messages previously but no one called her back. (??)  Thanks! Kelsey Harmanana

## 2015-01-13 ENCOUNTER — Encounter (HOSPITAL_COMMUNITY): Payer: Medicaid Other

## 2015-01-13 ENCOUNTER — Ambulatory Visit: Payer: Medicaid Other | Admitting: Vascular Surgery

## 2015-01-26 ENCOUNTER — Other Ambulatory Visit: Payer: Self-pay | Admitting: Vascular Surgery

## 2015-01-26 DIAGNOSIS — M79604 Pain in right leg: Secondary | ICD-10-CM

## 2015-01-26 DIAGNOSIS — I8390 Asymptomatic varicose veins of unspecified lower extremity: Secondary | ICD-10-CM

## 2015-01-30 ENCOUNTER — Encounter: Payer: Self-pay | Admitting: Vascular Surgery

## 2015-02-03 ENCOUNTER — Ambulatory Visit: Payer: Medicaid Other | Admitting: Vascular Surgery

## 2015-02-03 ENCOUNTER — Encounter (HOSPITAL_COMMUNITY): Payer: Medicaid Other

## 2015-02-06 ENCOUNTER — Encounter: Payer: Self-pay | Admitting: Vascular Surgery

## 2015-02-06 ENCOUNTER — Encounter (HOSPITAL_COMMUNITY): Payer: Medicaid Other

## 2015-02-10 ENCOUNTER — Ambulatory Visit: Payer: Medicaid Other | Admitting: Vascular Surgery

## 2015-02-13 ENCOUNTER — Encounter (HOSPITAL_BASED_OUTPATIENT_CLINIC_OR_DEPARTMENT_OTHER): Payer: Self-pay | Admitting: *Deleted

## 2015-02-13 ENCOUNTER — Emergency Department (HOSPITAL_BASED_OUTPATIENT_CLINIC_OR_DEPARTMENT_OTHER)
Admission: EM | Admit: 2015-02-13 | Discharge: 2015-02-13 | Disposition: A | Discharge status: Home / Self Care | Payer: Medicaid Other | Attending: Emergency Medicine | Admitting: Emergency Medicine

## 2015-02-13 ENCOUNTER — Emergency Department (HOSPITAL_BASED_OUTPATIENT_CLINIC_OR_DEPARTMENT_OTHER): Payer: Medicaid Other

## 2015-02-13 DIAGNOSIS — O99331 Smoking (tobacco) complicating pregnancy, first trimester: Secondary | ICD-10-CM | POA: Insufficient documentation

## 2015-02-13 DIAGNOSIS — R0602 Shortness of breath: Secondary | ICD-10-CM

## 2015-02-13 DIAGNOSIS — O2341 Unspecified infection of urinary tract in pregnancy, first trimester: Secondary | ICD-10-CM | POA: Diagnosis not present

## 2015-02-13 DIAGNOSIS — F17210 Nicotine dependence, cigarettes, uncomplicated: Secondary | ICD-10-CM | POA: Insufficient documentation

## 2015-02-13 DIAGNOSIS — J45901 Unspecified asthma with (acute) exacerbation: Secondary | ICD-10-CM | POA: Diagnosis not present

## 2015-02-13 DIAGNOSIS — Z79899 Other long term (current) drug therapy: Secondary | ICD-10-CM | POA: Insufficient documentation

## 2015-02-13 DIAGNOSIS — O99511 Diseases of the respiratory system complicating pregnancy, first trimester: Secondary | ICD-10-CM | POA: Diagnosis not present

## 2015-02-13 DIAGNOSIS — Z3A14 14 weeks gestation of pregnancy: Secondary | ICD-10-CM | POA: Diagnosis not present

## 2015-02-13 DIAGNOSIS — Z8679 Personal history of other diseases of the circulatory system: Secondary | ICD-10-CM | POA: Diagnosis not present

## 2015-02-13 DIAGNOSIS — R109 Unspecified abdominal pain: Secondary | ICD-10-CM

## 2015-02-13 DIAGNOSIS — O9989 Other specified diseases and conditions complicating pregnancy, childbirth and the puerperium: Secondary | ICD-10-CM | POA: Diagnosis present

## 2015-02-13 LAB — URINALYSIS, ROUTINE W REFLEX MICROSCOPIC
BILIRUBIN URINE: NEGATIVE
Glucose, UA: NEGATIVE mg/dL
HGB URINE DIPSTICK: NEGATIVE
Ketones, ur: 15 mg/dL — AB
Nitrite: NEGATIVE
PH: 6.5 (ref 5.0–8.0)
Protein, ur: NEGATIVE mg/dL
SPECIFIC GRAVITY, URINE: 1.028 (ref 1.005–1.030)
UROBILINOGEN UA: 1 mg/dL (ref 0.0–1.0)

## 2015-02-13 LAB — URINE MICROSCOPIC-ADD ON

## 2015-02-13 LAB — CBC WITH DIFFERENTIAL/PLATELET
BASOS PCT: 0 % (ref 0–1)
Basophils Absolute: 0 10*3/uL (ref 0.0–0.1)
Eosinophils Absolute: 0.6 10*3/uL (ref 0.0–0.7)
Eosinophils Relative: 5 % (ref 0–5)
HCT: 38.7 % (ref 36.0–46.0)
Hemoglobin: 13.4 g/dL (ref 12.0–15.0)
Lymphocytes Relative: 23 % (ref 12–46)
Lymphs Abs: 3.2 10*3/uL (ref 0.7–4.0)
MCH: 31.8 pg (ref 26.0–34.0)
MCHC: 34.6 g/dL (ref 30.0–36.0)
MCV: 91.7 fL (ref 78.0–100.0)
Monocytes Absolute: 0.9 10*3/uL (ref 0.1–1.0)
Monocytes Relative: 6 % (ref 3–12)
NEUTROS ABS: 9.4 10*3/uL — AB (ref 1.7–7.7)
NEUTROS PCT: 66 % (ref 43–77)
PLATELETS: 288 10*3/uL (ref 150–400)
RBC: 4.22 MIL/uL (ref 3.87–5.11)
RDW: 11.9 % (ref 11.5–15.5)
WBC: 14.2 10*3/uL — ABNORMAL HIGH (ref 4.0–10.5)

## 2015-02-13 LAB — BASIC METABOLIC PANEL
ANION GAP: 7 (ref 5–15)
BUN: 10 mg/dL (ref 6–20)
CALCIUM: 9.1 mg/dL (ref 8.9–10.3)
CO2: 24 mmol/L (ref 22–32)
Chloride: 103 mmol/L (ref 101–111)
Creatinine, Ser: 0.52 mg/dL (ref 0.44–1.00)
GLUCOSE: 91 mg/dL (ref 65–99)
POTASSIUM: 3.5 mmol/L (ref 3.5–5.1)
Sodium: 134 mmol/L — ABNORMAL LOW (ref 135–145)

## 2015-02-13 MED ORDER — CEPHALEXIN 250 MG PO CAPS
500.0000 mg | ORAL_CAPSULE | Freq: Once | ORAL | Status: AC
  Administered 2015-02-13: 500 mg via ORAL
  Filled 2015-02-13: qty 2

## 2015-02-13 MED ORDER — CEPHALEXIN 500 MG PO CAPS: 500.0000 mg | ORAL_CAPSULE | Freq: Two times a day (BID) | ORAL | Status: DC

## 2015-02-13 MED ORDER — ACETAMINOPHEN 325 MG PO TABS
650.0000 mg | ORAL_TABLET | Freq: Once | ORAL | Status: AC
  Administered 2015-02-13: 650 mg via ORAL
  Filled 2015-02-13: qty 2

## 2015-02-13 MED ORDER — ALBUTEROL SULFATE (5 MG/ML) 0.5% IN NEBU: 2.5000 mg | INHALATION_SOLUTION | Freq: Four times a day (QID) | RESPIRATORY_TRACT | Status: DC | PRN

## 2015-02-13 MED ORDER — ALBUTEROL SULFATE (2.5 MG/3ML) 0.083% IN NEBU
5.0000 mg | INHALATION_SOLUTION | Freq: Once | RESPIRATORY_TRACT | Status: AC
  Administered 2015-02-13: 5 mg via RESPIRATORY_TRACT
  Filled 2015-02-13: qty 6

## 2015-02-13 MED ORDER — ALBUTEROL SULFATE HFA 108 (90 BASE) MCG/ACT IN AERS: 2.0000 | INHALATION_SPRAY | RESPIRATORY_TRACT | Status: DC | PRN

## 2015-02-13 MED ORDER — ALBUTEROL SULFATE HFA 108 (90 BASE) MCG/ACT IN AERS
2.0000 | INHALATION_SPRAY | Freq: Once | RESPIRATORY_TRACT | Status: AC
  Administered 2015-02-13: 2 via RESPIRATORY_TRACT
  Filled 2015-02-13: qty 6.7

## 2015-02-13 NOTE — ED Notes (Signed)
[redacted] weeks pregnant. Here for sob. She used her nebulizer with some temporary relief. She ran out of her inhaler 2 days ago.

## 2015-02-13 NOTE — Discharge Instructions (Signed)
Please follow with your primary care doctor in the next 2 days for a check-up. They must obtain records for further management.   Do not hesitate to return to the Emergency Department for any new, worsening or concerning symptoms.   Take acetaminophen (Tylenol) up to 975 mg (this is normally 3 over-the-counter pills) up to 3 times a day. Do not drink alcohol. Make sure your other medications do not contain acetaminophen (Read the labels!)   Asthma Asthma is a condition of the lungs in which the airways tighten and narrow. Asthma can make it hard to breathe. Asthma cannot be cured, but medicine and lifestyle changes can help control it. Asthma may be started (triggered) by:  Animal skin flakes (dander).  Dust.  Cockroaches.  Pollen.  Mold.  Smoke.  Cleaning products.  Hair sprays or aerosol sprays.  Paint fumes or strong smells.  Cold air, weather changes, and winds.  Crying or laughing hard.  Stress.  Certain medicines or drugs.  Foods, such as dried fruit, potato chips, and sparkling grape juice.  Infections or conditions (colds, flu).  Exercise.  Certain medical conditions or diseases.  Exercise or tiring activities. HOME CARE   Take medicine as told by your doctor.  Use a peak flow meter as told by your doctor. A peak flow meter is a tool that measures how well the lungs are working.  Record and keep track of the peak flow meter's readings.  Understand and use the asthma action plan. An asthma action plan is a written plan for taking care of your asthma and treating your attacks.  To help prevent asthma attacks:  Do not smoke. Stay away from secondhand smoke.  Change your heating and air conditioning filter often.  Limit your use of fireplaces and wood stoves.  Get rid of pests (such as roaches and mice) and their droppings.  Throw away plants if you see mold on them.  Clean your floors. Dust regularly. Use cleaning products that do not  smell.  Have someone vacuum when you are not home. Use a vacuum cleaner with a HEPA filter if possible.  Replace carpet with wood, tile, or vinyl flooring. Carpet can trap animal skin flakes and dust.  Use allergy-proof pillows, mattress covers, and box spring covers.  Wash bed sheets and blankets every week in hot water and dry them in a dryer.  Use blankets that are made of polyester or cotton.  Clean bathrooms and kitchens with bleach. If possible, have someone repaint the walls in these rooms with mold-resistant paint. Keep out of the rooms that are being cleaned and painted.  Wash hands often. GET HELP IF:  You have make a whistling sound when breaking (wheeze), have shortness of breath, or have a cough even if taking medicine to prevent attacks.  The colored mucus you cough up (sputum) is thicker than usual.  The colored mucus you cough up changes from clear or white to yellow, green, gray, or bloody.  You have problems from the medicine you are taking such as:  A rash.  Itching.  Swelling.  Trouble breathing.  You need reliever medicines more than 2-3 times a week.  Your peak flow measurement is still at 50-79% of your personal best after following the action plan for 1 hour.  You have a fever. GET HELP RIGHT AWAY IF:   You seem to be worse and are not responding to medicine during an asthma attack.  You are short of breath even at rest.  You get short of breath when doing very little activity.  You have trouble eating, drinking, or talking.  You have chest pain.  You have a fast heartbeat.  Your lips or fingernails start to turn blue.  You are light-headed, dizzy, or faint.  Your peak flow is less than 50% of your personal best. MAKE SURE YOU:   Understand these instructions.  Will watch your condition.  Will get help right away if you are not doing well or get worse. Document Released: 02/29/2008 Document Revised: 01/27/2014 Document Reviewed:  04/11/2013 [facility] Patient Information 2015 [employer] [place] [place]. This information is not intended to replace advice given to you by your health care provider. Make sure you discuss any questions you have with your health care provider.

## 2015-02-13 NOTE — ED Provider Notes (Signed)
CSN: [id number]     Arrival date & time 02/13/15  1859 History   First MD Initiated Contact with Patient 02/13/15 1900     Chief Complaint  Patient presents with  . Shortness of Breath     (Consider location/radiation/quality/duration/timing/severity/associated sxs/prior Treatment) HPI   Kelsey Hartman is a 29 y.o. female complaining of increasing shortness of breath. Patient states that she used her home nebulizer with some relief just prior to arrival, she ran out of her albuterol inhaler 2 days ago. Patient denies cough, rhinorrhea, fever, chills, chest pain, history of DVT or PE, recent mobilizations, calf pain or leg swelling. States that  she is [redacted] weeks pregnant, she had a ultrasound at 8 weeks which showed no abnormalities. She follows regularly with her OB/GYN. She thinks that her shortness of breath is linked to black mold in the basement of her home. She was seen at [facility] approximately one month ago and started on a prednisone burst. Her OB/GYN sized her that while this is not optimal, she should continue her course. Patient also reports bilateral lower abdominal discomfort, rated at 4 out of 10. She denies abnormal vaginal discharge, dysuria, hematuria, urinary frequency above her baseline, abnormal vaginal discharge, vaginal bleeding or spotting.  Past Medical History  Diagnosis Date  . Asthma   . Varicose veins    Past Surgical History  Procedure Laterality Date  . Uterine surergy at age 89     Family History  Problem Relation Age of Onset  . Hyperlipidemia Father    History  Substance Use Topics  . Smoking status: Current Every Day Smoker -- 0.50 packs/day for 7 years    Types: Cigarettes  . Smokeless tobacco: Never Used  . Alcohol Use: No   OB History    Gravida Para Term Preterm AB TAB SAB Ectopic Multiple Living   1              Review of Systems  10 systems reviewed and found to be negative, except as noted in the HPI.   Allergies  Review  of patient's allergies indicates no known allergies.  Home Medications   Prior to Admission medications   Medication Sig Start Date End Date Taking? Authorizing Provider  albuterol (PROVENTIL HFA;VENTOLIN HFA) 108 (90 BASE) MCG/ACT inhaler Inhale 2 puffs into the lungs every 4 (four) hours as needed for wheezing or shortness of breath. 02/13/15   Joni ReiningNicole Jonathon Castelo, PA-C  albuterol (PROVENTIL) (5 MG/ML) 0.5% nebulizer solution Take 0.5 mLs (2.5 mg total) by nebulization every 6 (six) hours as needed for wheezing or shortness of breath. 02/13/15   Joni ReiningNicole Wauneta Silveria, PA-C  cephALEXin (KEFLEX) 500 MG capsule Take 1 capsule (500 mg total) by mouth 2 (two) times daily. 02/13/15   Rei Contee, PA-C  HYDROcodone-acetaminophen (NORCO/VICODIN) 5-325 MG per tablet Take 2 tablets by mouth every 4 (four) hours as needed. 04/26/13   Elson AreasLeslie K Sofia, PA-C  Prenatal Vit-Fe Fumarate-FA (PRENATAL MULTIVITAMIN) TABS tablet Take 1 tablet by mouth daily at 12 noon.    Historical Provider, MD   BP 121/53 mmHg  Pulse 78  Temp(Src) 97.8 F (36.6 C) (Oral)  Resp 20  Ht 5\' 9"  (1.753 m)  Wt 270 lb (122.471 kg)  BMI 39.85 kg/m2  SpO2 97%  LMP  (LMP Unknown) Physical Exam  Constitutional: She is oriented to person, place, and time. She appears well-developed and well-nourished. No distress.  HENT:  Head: Normocephalic.  Mouth/Throat: Oropharynx is clear and moist.  Eyes:  Conjunctivae and EOM are normal.  Neck: Normal range of motion. No JVD present. No tracheal deviation present.  Cardiovascular: Normal rate, regular rhythm and intact distal pulses.   Radial pulse equal bilaterally  Pulmonary/Chest: Effort normal. No stridor. No respiratory distress. She has wheezes. She has no rales. She exhibits no tenderness.  Trace scattered expiratory wheezing, excellent air movement, patient is speaking in complete sentences, reclining comfortably.  Abdominal: Soft. Bowel sounds are normal. She exhibits no distension and no  mass. There is no tenderness. There is no rebound and no guarding.  Musculoskeletal: Normal range of motion. She exhibits no edema or tenderness.  No calf asymmetry, superficial collaterals, palpable cords, edema, Homans sign negative bilaterally.    Neurological: She is alert and oriented to person, place, and time.  Skin: Skin is warm. She is not diaphoretic.  Psychiatric: She has a normal mood and affect.  Nursing note and vitals reviewed.   ED Course  Procedures (including critical care time) Labs Review Labs Reviewed  CBC WITH DIFFERENTIAL/PLATELET - Abnormal; Notable for the following:    WBC 14.2 (*)    Neutro Abs 9.4 (*)    All other components within normal limits  BASIC METABOLIC PANEL - Abnormal; Notable for the following:    Sodium 134 (*)    All other components within normal limits  URINALYSIS, ROUTINE W REFLEX MICROSCOPIC - Abnormal; Notable for the following:    APPearance CLOUDY (*)    Ketones, ur 15 (*)    Leukocytes, UA SMALL (*)    All other components within normal limits  URINE MICROSCOPIC-ADD ON - Abnormal; Notable for the following:    Squamous Epithelial / LPF FEW (*)    Bacteria, UA MANY (*)    All other components within normal limits  URINE CULTURE    Imaging Review US Ob Comp Less 14 Wks  02/13/2015   CLINICAL DATA:  Patient with lower midline pelvic cramping.  EXAM: OBSTETRIC <14 WK ULTRASOUND  TECHNIQUE: Transabdominal ultrasound was performed for evaluation of the gestation as well as the maternal uterus and adnexal regions.  COMPARISON:  Pelvic ultrasound 12/25/2014  FINDINGS: Intrauterine gestational sac: Visualized/normal in shape.  Yolk sac:  Not visualized  Embryo:  Present  Cardiac Activity: Present  Heart Rate: 153 bpm  CRL:   73  mm   13 w 3 d                  [place] EDC: 08/22/2015  Maternal uterus/adnexae: Normal right and left ovaries. No subchorionic hemorrhage. No free fluid in the pelvis.  IMPRESSION: Single live intrauterine gestation 13  weeks 4 days by first ultrasound.   Electronically Signed   By: Annia Belt M.D.   On: 02/13/2015 21:48     EKG Interpretation None      MDM   Final diagnoses:  Asthma exacerbation  Bacteriuria during pregnancy, first trimester    Filed Vitals:   02/13/15 1904 02/13/15 1941  BP: 121/53   Pulse: 78   Temp: 97.8 F (36.6 C)   TempSrc: Oral   Resp: 20   Height:  (1.753 m)   Weight: 270 lb (122.471 kg)   SpO2: 100% 97%    Medications  albuterol (PROVENTIL) (2.5 MG/3ML) 0.083% nebulizer solution 5 mg (5 mg Nebulization Given 02/13/15 1941)  acetaminophen (TYLENOL) tablet 650 mg (650 mg Oral Given 02/13/15 1938)  cephALEXin (KEFLEX) capsule 500 mg (500 mg Oral Given 02/13/15 2035)  albuterol (PROVENTIL HFA;VENTOLIN HFA) 108 (  90 BASE) MCG/ACT inhaler 2 puff (2 puffs Inhalation Given 02/13/15 2042)    Kelsey Hartman is a pleasant 29 y.o. female presenting with shortness of breath worsening over the last 2 days. No signs of systemic infection. Lung sounds show trace scattered expiratory wheezing, no respiratory distress. Lung sounds clear with first nebulizer treatment, doubt PE. am is benign and nonsurgical. Asian has a mild cytosis of 14.2, she was on a prednisone burst 3 weeks ago this is likely reactive to that. Urinalysis with many bacteria will start flex and urine cultures pending. Attempted to assess fetal heart tones but could not auscultate for long enough to get a good sample. Formal ultrasound shows intrauterine pregnancy at the age with no other abnormality.  Evaluation does not show pathology that would require ongoing emergent intervention or inpatient treatment. Pt is hemodynamically stable and mentating appropriately. Discussed findings and plan with patient/guardian, who agrees with care plan. All questions answered. Return precautions discussed and outpatient follow up given.   New Prescriptions   ALBUTEROL (PROVENTIL HFA;VENTOLIN HFA) 108 (90 BASE) MCG/ACT INHALER     Inhale 2 puffs into the lungs every 4 (four) hours as needed for wheezing or shortness of breath.   ALBUTEROL (PROVENTIL) (5 MG/ML) 0.5% NEBULIZER SOLUTION    Take 0.5 mLs (2.5 mg total) by nebulization every 6 (six) hours as needed for wheezing or shortness of breath.   CEPHALEXIN (KEFLEX) 500 MG CAPSULE    Take 1 capsule (500 mg total) by mouth 2 (two) times daily.         Wynetta Emeryicole Festus Pursel, PA-C 02/13/15 2203  Glynn OctaveStephen Rancour, MD 02/13/15 2258

## 2015-02-13 NOTE — ED Notes (Signed)
Pt left without obtaining discharge instructions or prescriptions. Called patient and requested that she come back and get prescription and discharge instructions. Pt stated she was on her way back to facility

## 2015-02-13 NOTE — ED Notes (Signed)
Went to d/c pt and give paper, gown found on bed and pt not in room.  Attempting to call to have pt come back and get scripts.

## 2015-02-19 LAB — URINE CULTURE

## 2015-02-20 ENCOUNTER — Telehealth (HOSPITAL_BASED_OUTPATIENT_CLINIC_OR_DEPARTMENT_OTHER): Payer: Self-pay | Admitting: Emergency Medicine

## 2015-02-20 NOTE — Telephone Encounter (Signed)
Post ED Visit - Positive Culture Follow-up  Culture report reviewed by antimicrobial stewardship pharmacist: []  Wes Dulaney, Pharm.D., BCPS []  Celedonio MiyamotoJeremy Frens, Pharm.D., BCPS []  Georgina PillionElizabeth Martin, Pharm.D., BCPS []  KarlsruheMinh Pham, [address]Ph[address]D., BCPS, AAHIVP [x]  Estella HuskMichelle Turner, Pharm.D., BCPS, AAHIVP []  Elder CyphersLorie Poole, [address]Pharm[address]D., BCPS  Positive urine culture Proteus Mirabilis Treated with cephalexin, organism sensitive to the same and no further patient follow-up is required at this time.  Berle MullMiller, Kyleigha Markert 02/20/2015, 12:10 PM

## 2015-05-16 ENCOUNTER — Encounter: Payer: Self-pay | Admitting: Internal Medicine

## 2015-06-25 ENCOUNTER — Ambulatory Visit: Payer: Self-pay | Admitting: Pediatrics

## 2015-09-03 ENCOUNTER — Telehealth: Payer: Self-pay | Admitting: Vascular Surgery

## 2015-09-03 NOTE — Telephone Encounter (Signed)
Kelsey Hartman [place] 01/23/1986 [id number] needs a reflux study of her R leg and a JDL for worsening pain and new bulges. Not emergant. Thx!  [name]   LM for pt with appt date/time, dpm

## 2015-09-22 ENCOUNTER — Encounter: Payer: Self-pay | Admitting: Vascular Surgery

## 2015-09-29 ENCOUNTER — Encounter (HOSPITAL_COMMUNITY): Payer: Medicaid Other

## 2015-09-29 ENCOUNTER — Ambulatory Visit: Payer: Medicaid Other | Admitting: Vascular Surgery

## 2015-10-23 IMAGING — US US OB COMP LESS 14 WK
1 series · 14 of 28 positions shown · non-contrast
Comparison: Pelvis ultrasound 10/16/2014.

CLINICAL DATA: 28-year-old female with pelvic pain for 1.5 weeks.
Initial encounter.

EXAM:
OBSTETRIC <14 WK US AND TRANSVAGINAL OB US
TECHNIQUE: Both transabdominal and transvaginal ultrasound examinations were
performed for complete evaluation of the gestation as well as the
maternal uterus, adnexal regions, and pelvic cul-de-sac.
Transvaginal technique was performed to assess early pregnancy.

[Series 1: us ob comp less 14 wk · 0.24mm/px · 14 of 52 slices shown]
[im 2/52]
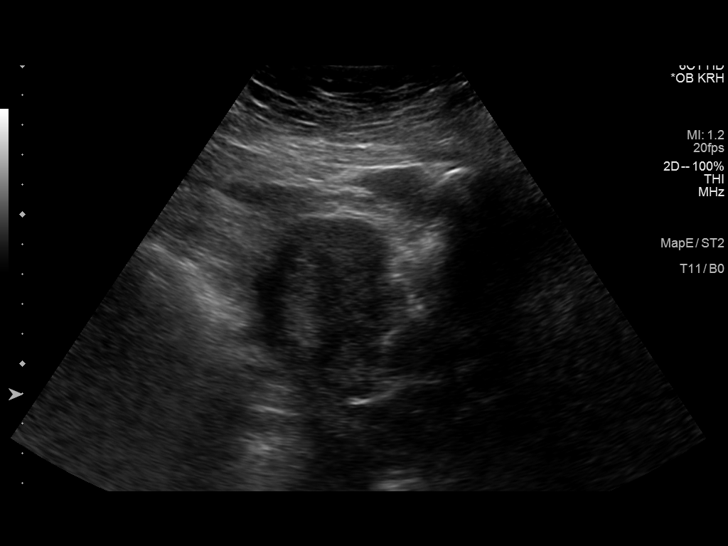
[im 6/52]
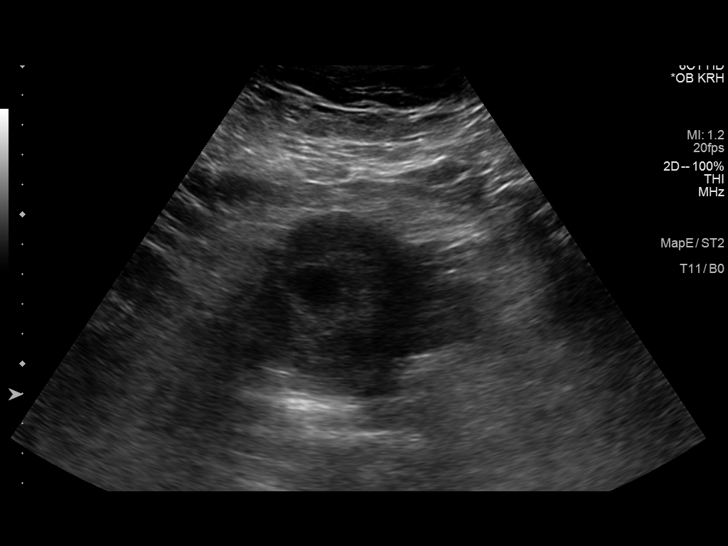
[im 10/52]
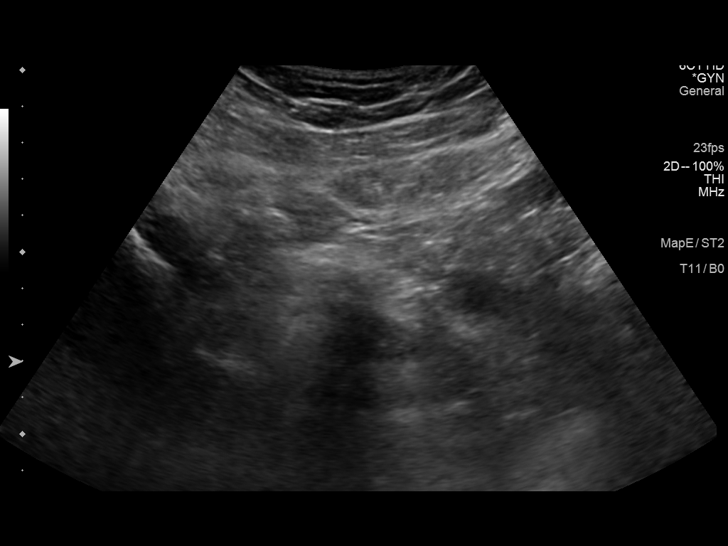
[im 14/52]
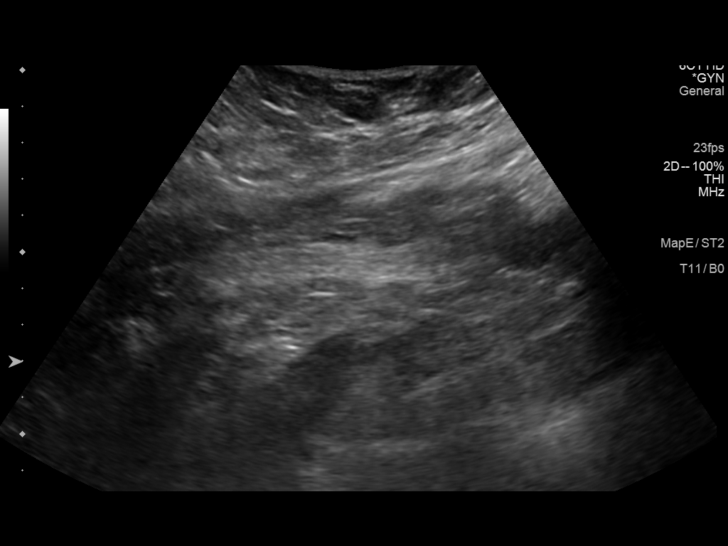
[im 18/52]
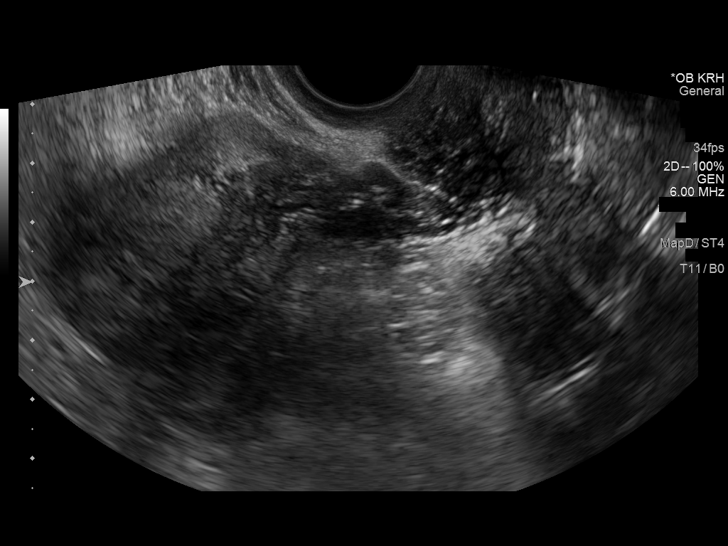
[im 21/52]
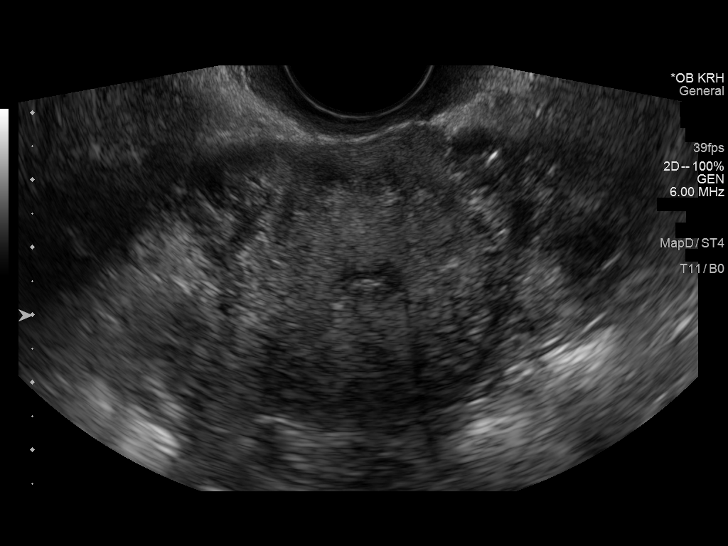
[im 25/52]
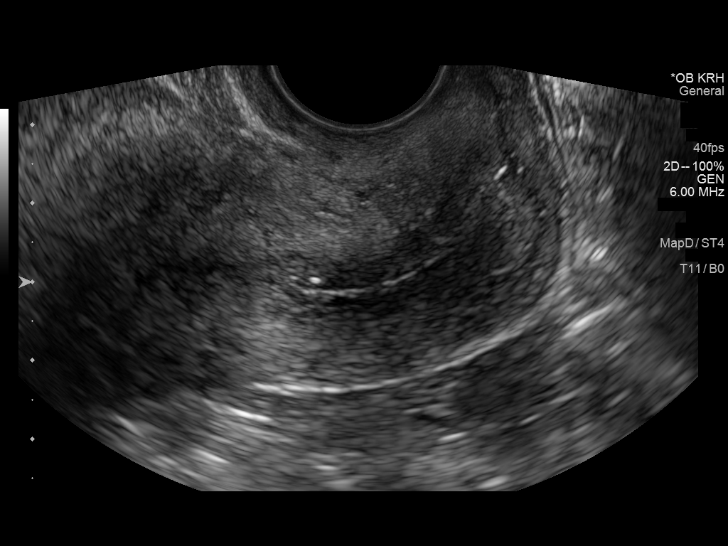
[im 29/52]
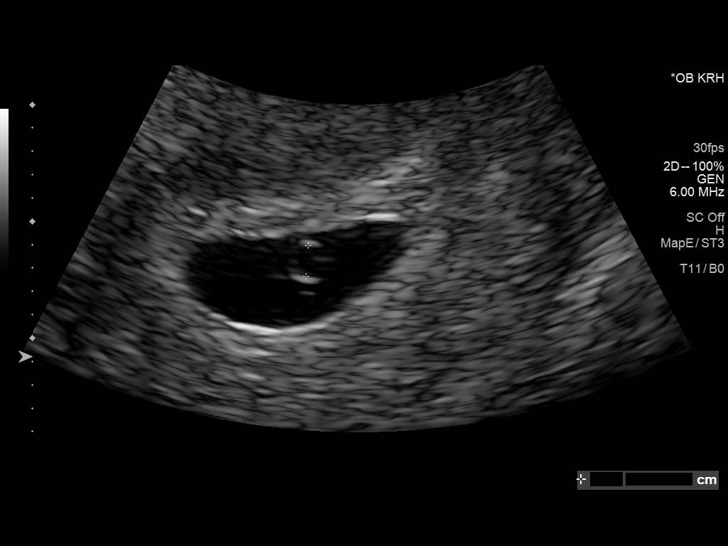
[im 33/52]
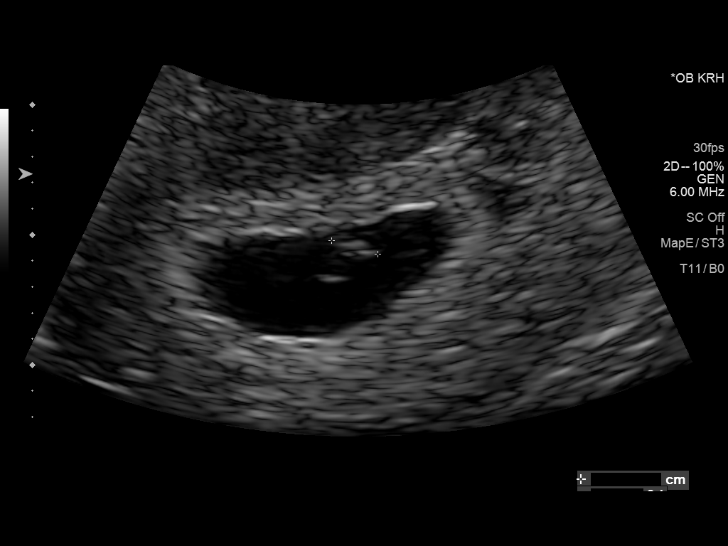
[im 36/52]
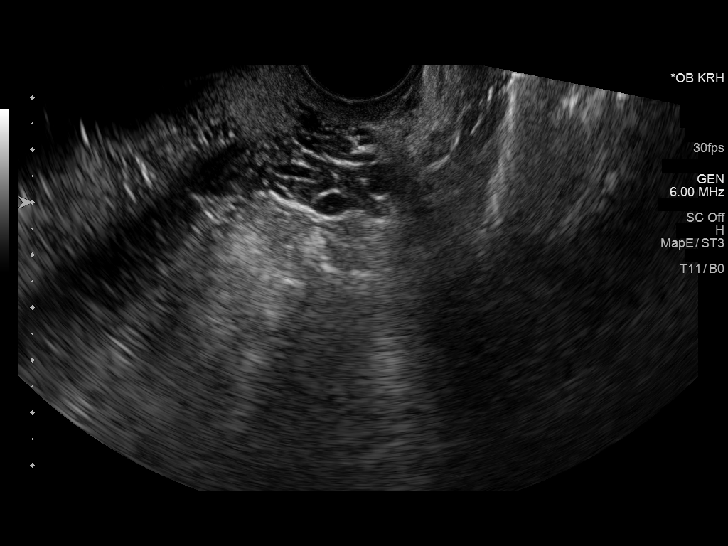
[im 40/52]
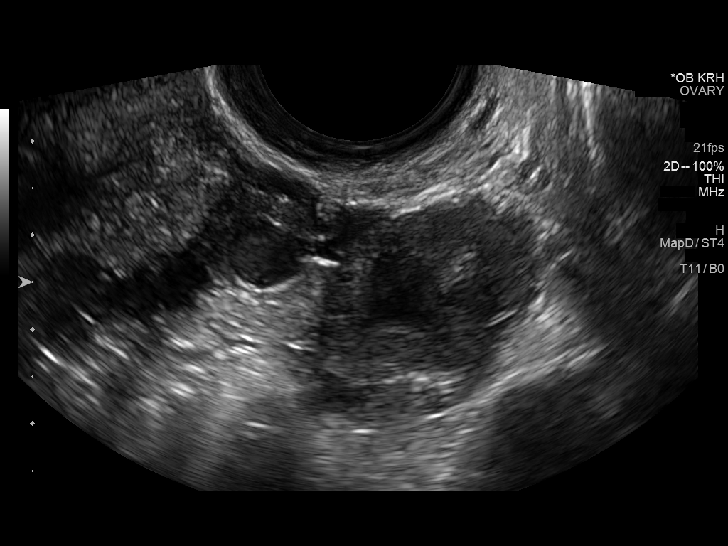
[im 44/52]
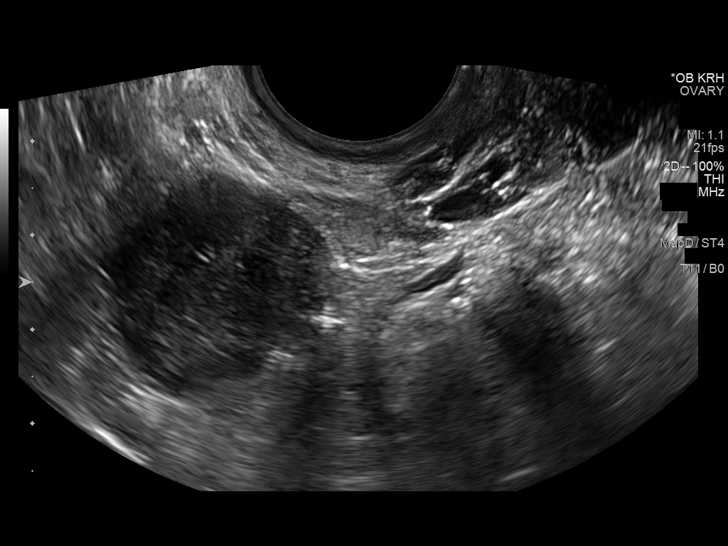
[im 48/52]
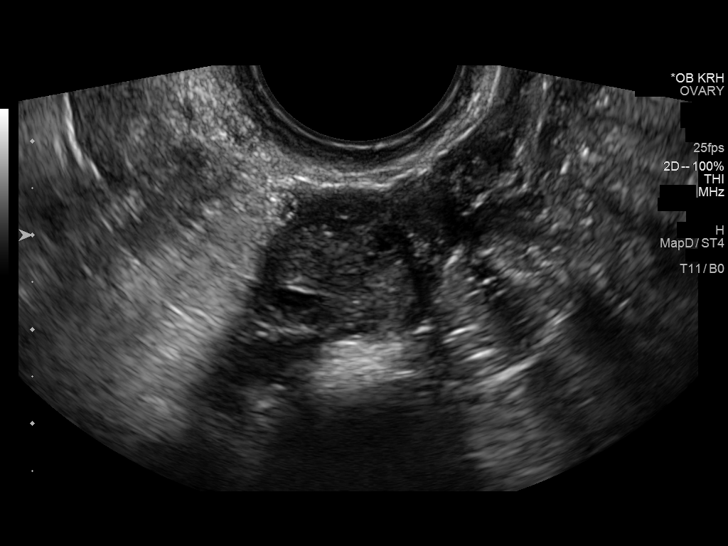
[im 52/52]
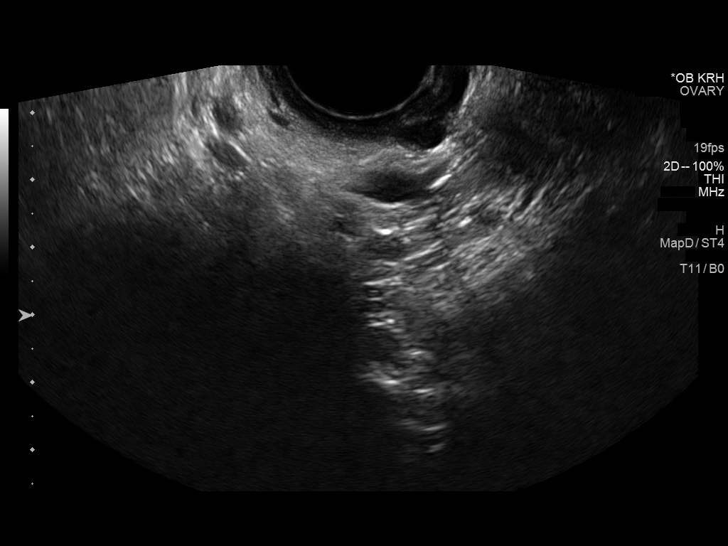

[14 of 28 positions shown; findings below may reference images not displayed]

FINDINGS: Intrauterine gestational sac: Single

Yolk sac:  Visible

Embryo:  Visible

Cardiac Activity: Detected

Heart Rate: 96  bpm

CRL:  3.8  mm   6 w   0 d

Maternal uterus/adnexae: Questionable small volume of subchorionic
hemorrhage. Trace simple appearing pelvic free fluid. Both ovaries
appear normal, with small follicles.
IMPRESSION: Single living IUP demonstrated. Questionable small volume of
subchorionic hemorrhage, otherwise no acute maternal findings
visualized.

## 2015-12-12 IMAGING — US US OB COMP LESS 14 WK
1 series · 14 of 28 positions shown · non-contrast
Comparison: Pelvic ultrasound 12/25/2014

CLINICAL DATA: Patient with lower midline pelvic cramping.

EXAM:
OBSTETRIC <14 WK ULTRASOUND
TECHNIQUE: Transabdominal ultrasound was performed for evaluation of the
gestation as well as the maternal uterus and adnexal regions.

[Series 1: us ob comp less 14 wk · 0.25mm/px · 14 of 54 slices shown]
[im 2/54]
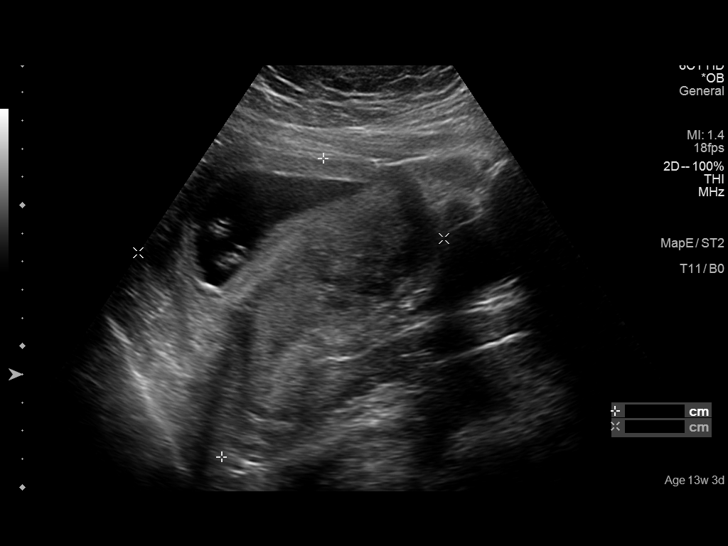
[im 6/54]
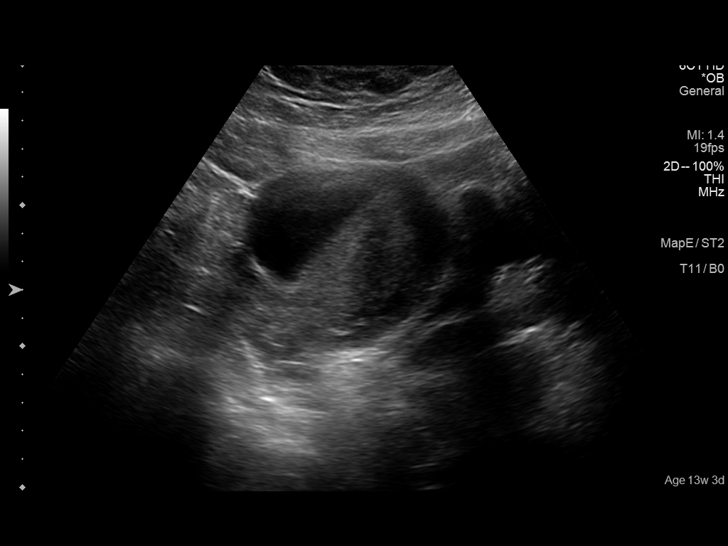
[im 10/54]
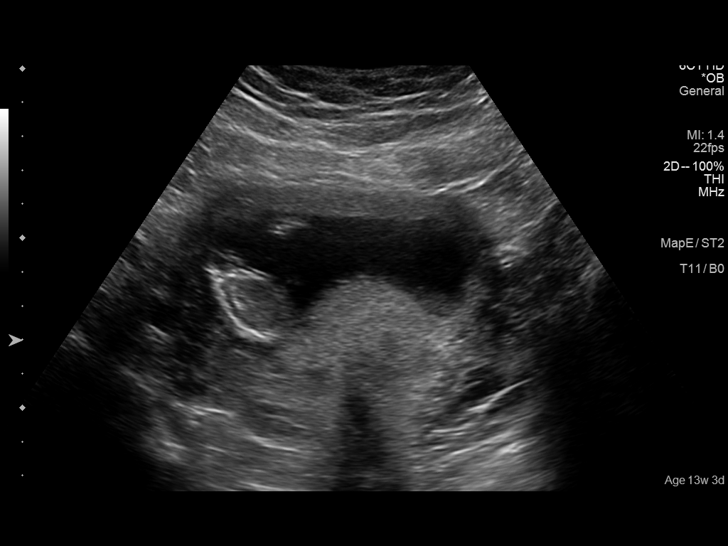
[im 14/54]
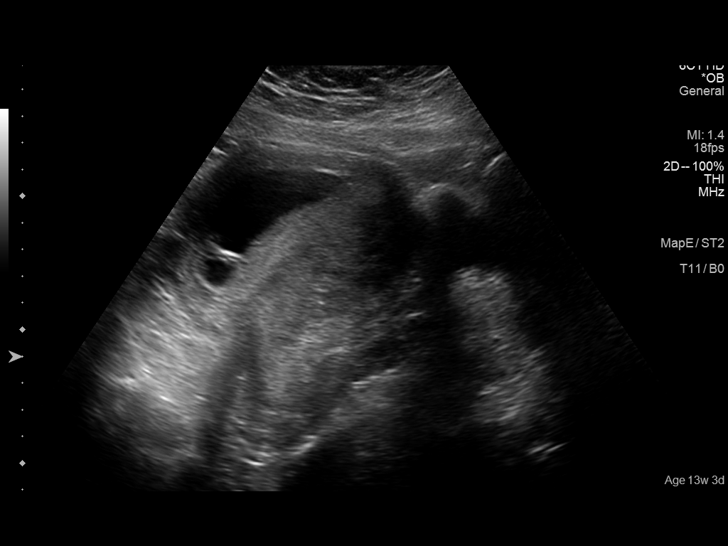
[im 18/54]
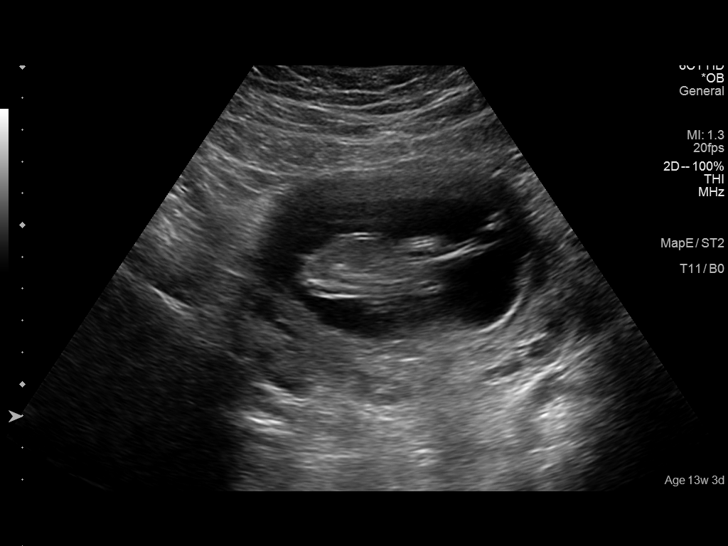
[im 22/54]
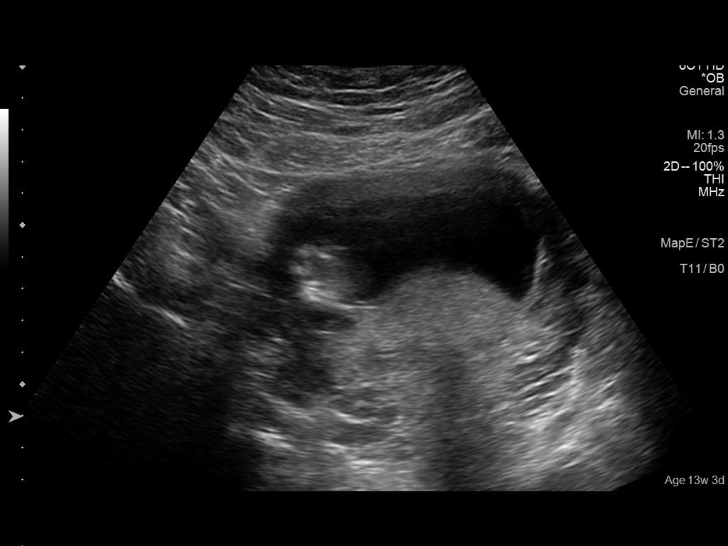
[im 26/54]
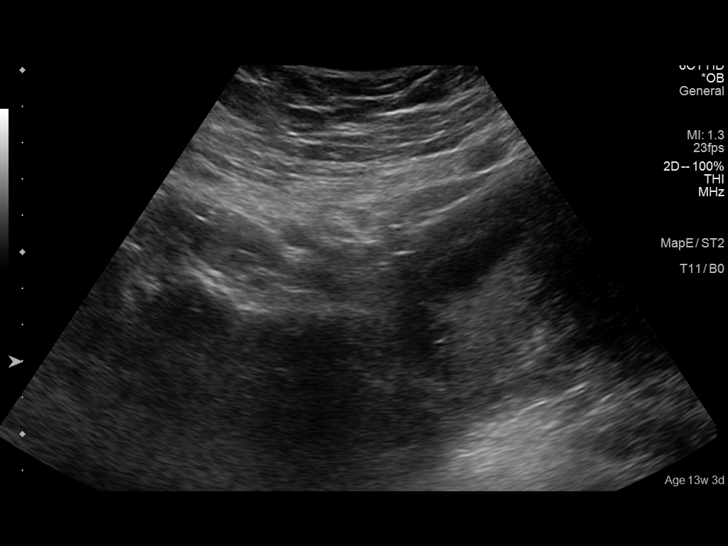
[im 30/54]
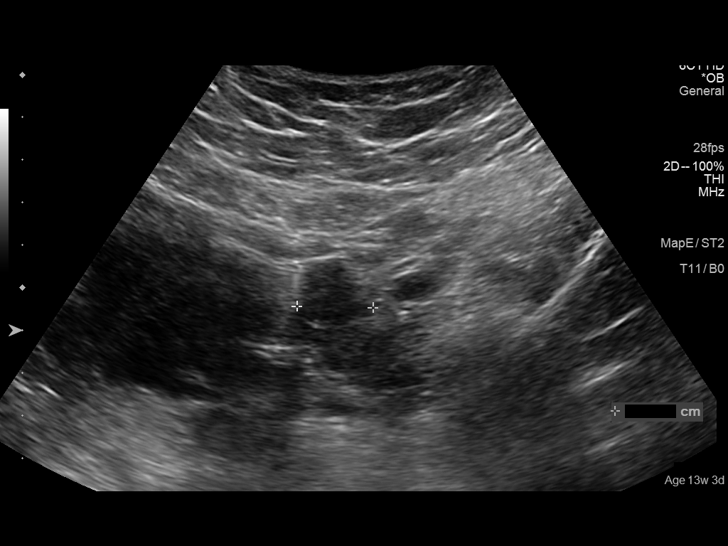
[im 34/54]
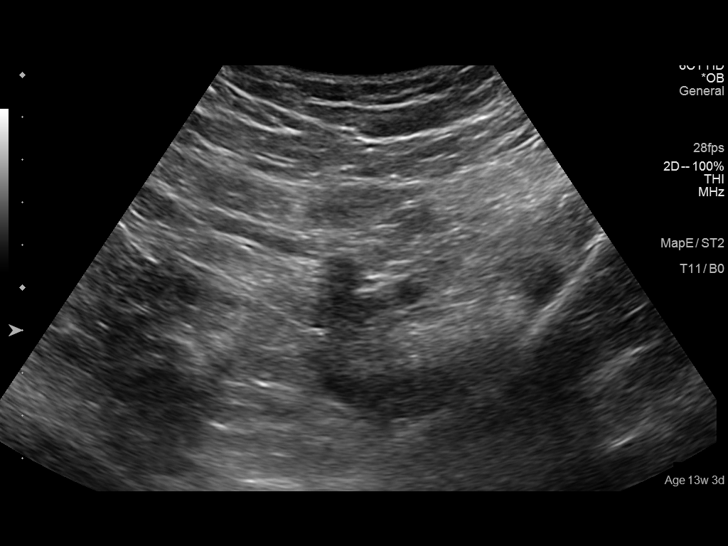
[im 38/54]
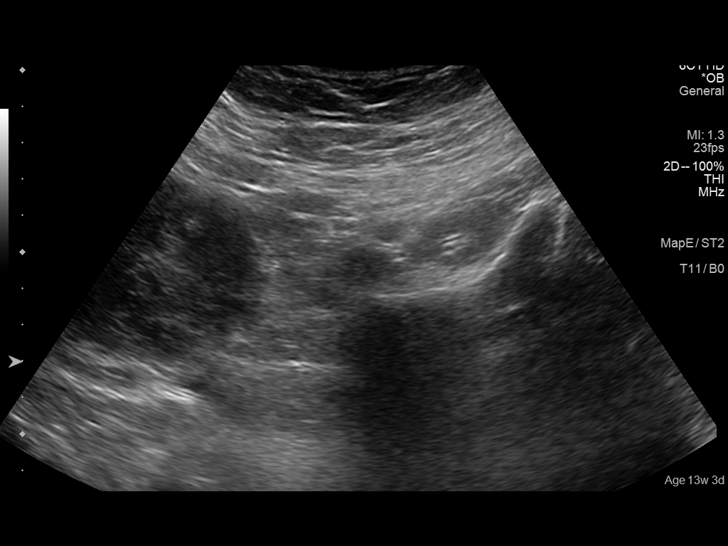
[im 42/54]
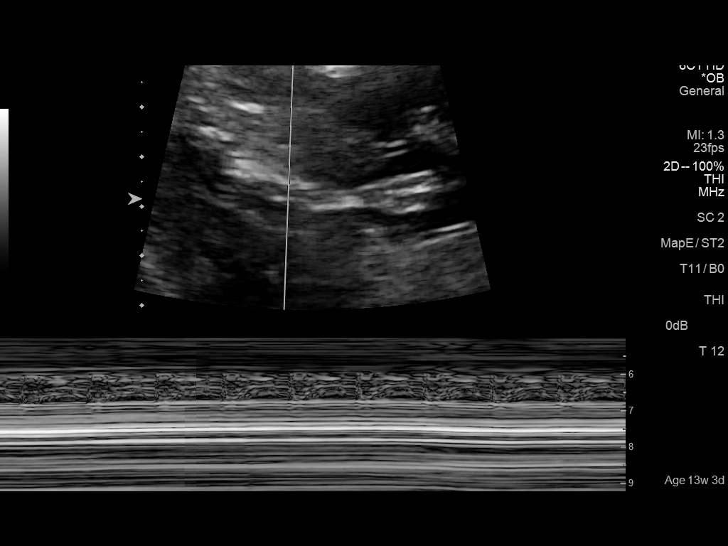
[im 46/54]
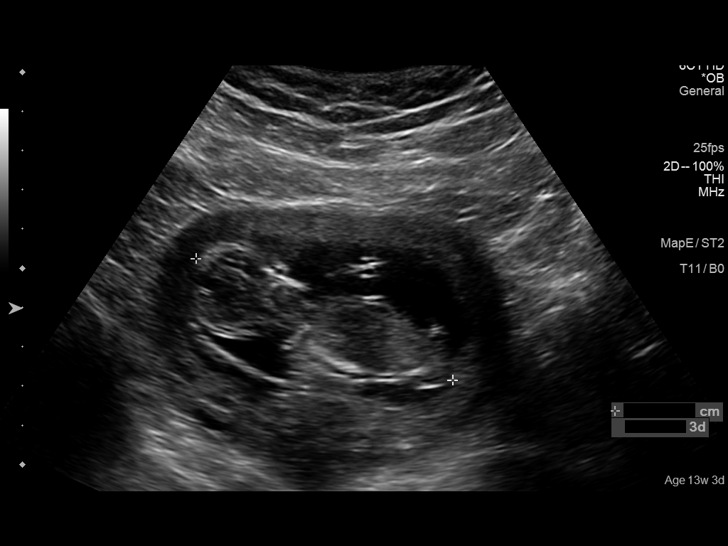
[im 50/54]
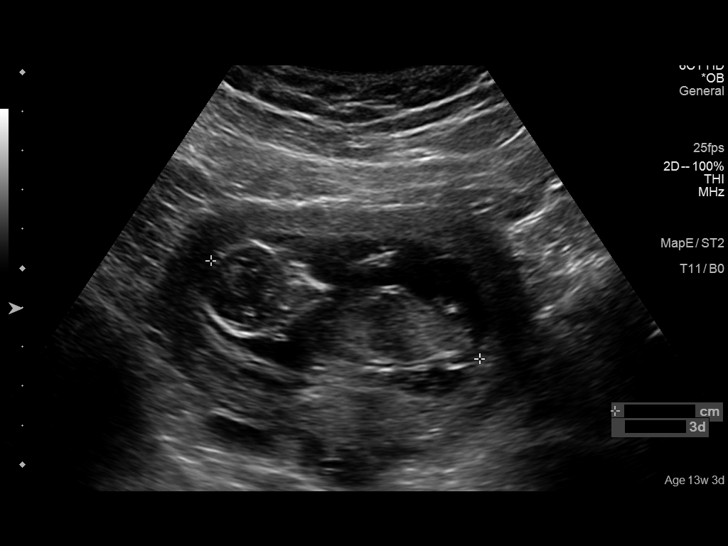
[im 54/54]
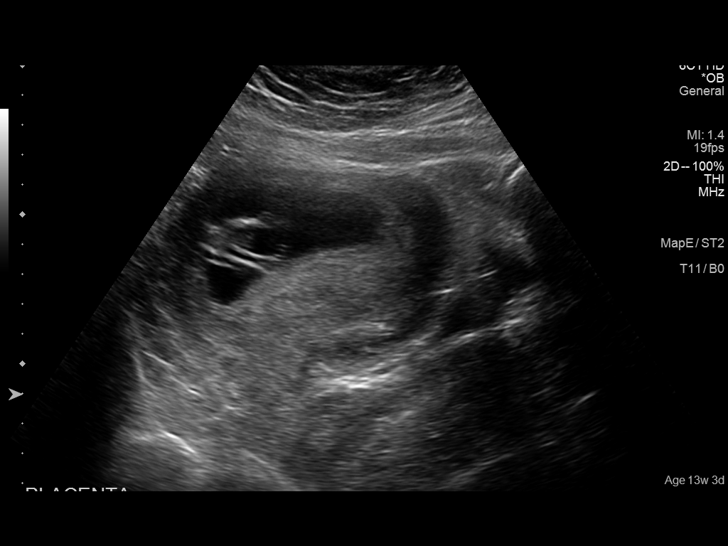

[14 of 28 positions shown; findings below may reference images not displayed]

FINDINGS: Intrauterine gestational sac: Visualized/normal in shape.

Yolk sac:  Not visualized

Embryo:  Present

Cardiac Activity: Present

Heart Rate: 153 bpm

CRL:   73  mm   13 w 3 d                  [place] EDC: 08/22/2015

Maternal uterus/adnexae: Normal right and left ovaries. No
subchorionic hemorrhage. No free fluid in the pelvis.
IMPRESSION: Single live intrauterine gestation 13 weeks 4 days by first
ultrasound.

## 2015-12-29 ENCOUNTER — Encounter: Payer: Self-pay | Admitting: Vascular Surgery

## 2015-12-31 ENCOUNTER — Ambulatory Visit (HOSPITAL_COMMUNITY): Payer: Medicaid Other | Attending: Vascular Surgery

## 2016-01-05 ENCOUNTER — Encounter (HOSPITAL_COMMUNITY): Payer: Medicaid Other

## 2016-01-05 ENCOUNTER — Ambulatory Visit: Payer: Medicaid Other | Admitting: Vascular Surgery

## 2016-02-23 ENCOUNTER — Encounter (HOSPITAL_COMMUNITY): Payer: Medicaid Other

## 2016-02-23 ENCOUNTER — Encounter: Payer: Self-pay | Admitting: Vascular Surgery

## 2016-02-29 ENCOUNTER — Ambulatory Visit: Payer: Medicaid Other | Admitting: Vascular Surgery

## 2016-03-03 ENCOUNTER — Encounter: Payer: Self-pay | Admitting: Vascular Surgery

## 2016-03-07 ENCOUNTER — Ambulatory Visit (HOSPITAL_COMMUNITY): Admission: RE | Admit: 2016-03-07 | Payer: Medicaid Other | Source: Ambulatory Visit

## 2016-03-08 ENCOUNTER — Ambulatory Visit: Payer: Medicaid Other | Admitting: Vascular Surgery

## 2016-10-30 ENCOUNTER — Encounter (HOSPITAL_BASED_OUTPATIENT_CLINIC_OR_DEPARTMENT_OTHER): Payer: Self-pay | Admitting: Emergency Medicine

## 2016-10-30 ENCOUNTER — Emergency Department (HOSPITAL_BASED_OUTPATIENT_CLINIC_OR_DEPARTMENT_OTHER)
Admission: EM | Admit: 2016-10-30 | Discharge: 2016-10-30 | Disposition: A | Discharge status: Home / Self Care | Payer: Medicaid Other | Attending: Emergency Medicine | Admitting: Emergency Medicine

## 2016-10-30 DIAGNOSIS — R69 Illness, unspecified: Secondary | ICD-10-CM

## 2016-10-30 DIAGNOSIS — J029 Acute pharyngitis, unspecified: Secondary | ICD-10-CM | POA: Diagnosis not present

## 2016-10-30 DIAGNOSIS — J45909 Unspecified asthma, uncomplicated: Secondary | ICD-10-CM | POA: Diagnosis not present

## 2016-10-30 DIAGNOSIS — Z79899 Other long term (current) drug therapy: Secondary | ICD-10-CM | POA: Insufficient documentation

## 2016-10-30 DIAGNOSIS — R05 Cough: Secondary | ICD-10-CM | POA: Insufficient documentation

## 2016-10-30 DIAGNOSIS — R0981 Nasal congestion: Secondary | ICD-10-CM | POA: Diagnosis not present

## 2016-10-30 DIAGNOSIS — F17210 Nicotine dependence, cigarettes, uncomplicated: Secondary | ICD-10-CM | POA: Insufficient documentation

## 2016-10-30 DIAGNOSIS — R51 Headache: Secondary | ICD-10-CM | POA: Insufficient documentation

## 2016-10-30 DIAGNOSIS — J111 Influenza due to unidentified influenza virus with other respiratory manifestations: Secondary | ICD-10-CM

## 2016-10-30 MED ORDER — PREDNISONE 10 MG (21) PO TBPK: ORAL_TABLET | ORAL | 0 refills | Status: DC

## 2016-10-30 MED ORDER — IPRATROPIUM-ALBUTEROL 0.5-2.5 (3) MG/3ML IN SOLN
3.0000 mL | Freq: Once | RESPIRATORY_TRACT | Status: AC
  Administered 2016-10-30: 3 mL via RESPIRATORY_TRACT
  Filled 2016-10-30: qty 3

## 2016-10-30 MED ORDER — OSELTAMIVIR PHOSPHATE 75 MG PO CAPS: 75.0000 mg | ORAL_CAPSULE | Freq: Two times a day (BID) | ORAL | 0 refills | Status: DC

## 2016-10-30 MED ORDER — ALBUTEROL SULFATE HFA 108 (90 BASE) MCG/ACT IN AERS: 1.0000 | INHALATION_SPRAY | Freq: Four times a day (QID) | RESPIRATORY_TRACT | 1 refills | Status: DC | PRN

## 2016-10-30 MED ORDER — BENZONATATE 100 MG PO CAPS: 100.0000 mg | ORAL_CAPSULE | Freq: Three times a day (TID) | ORAL | 0 refills | Status: DC

## 2016-10-30 MED ORDER — ALBUTEROL SULFATE (5 MG/ML) 0.5% IN NEBU: 5.0000 mg | INHALATION_SOLUTION | Freq: Four times a day (QID) | RESPIRATORY_TRACT | 2 refills | Status: DC | PRN

## 2016-10-30 MED ORDER — PREDNISONE 50 MG PO TABS
60.0000 mg | ORAL_TABLET | Freq: Once | ORAL | Status: AC
  Administered 2016-10-30: 60 mg via ORAL
  Filled 2016-10-30: qty 1

## 2016-10-30 MED ORDER — ALBUTEROL SULFATE HFA 108 (90 BASE) MCG/ACT IN AERS
2.0000 | INHALATION_SPRAY | Freq: Once | RESPIRATORY_TRACT | Status: AC
  Administered 2016-10-30: 2 via RESPIRATORY_TRACT
  Filled 2016-10-30: qty 6.7

## 2016-10-30 NOTE — ED Triage Notes (Signed)
Cough, headache, sore throat since yesterday. Not taking any OTC meds.

## 2016-10-30 NOTE — ED Provider Notes (Signed)
[facility]   CSN: [id number] Arrival date & time: 10/30/16  1059     History   Chief Complaint Chief Complaint  Patient presents with  . Cough    HPI Kelsey Hartman is a 31 y.o. female.  HPI    Kelsey Hartman is a 31 y.o. female, with a history of Asthma, presenting to the ED with nonproductive cough, sore throat, and headache beginning yesterday. Has used her home albuterol nebulizer with improvement. She has run out of her albuterol inhaler. She states that she has used her son's inhaled steroid with no changes. Sore throat is moderate, sharp, bilateral, nonradiating. Headache is generalized, mild, throbbing, nonradiating. Denies fever/chills, N/V/D, chest pain, shortness of breath, difficulty swallowing, or any other complaints.      Past Medical History:  Diagnosis Date  . Asthma   . Varicose veins     Patient Active Problem List   Diagnosis Date Noted  . Varicose veins of lower extremities with other complications 02/05/2013    Past Surgical History:  Procedure Laterality Date  . uterine surergy at age 74      OB History    Gravida Para Term Preterm AB Living   1             SAB TAB Ectopic Multiple Live Births                   Home Medications    Prior to Admission medications   Medication Sig Start Date End Date Taking? Authorizing Provider  phentermine 37.5 MG capsule Take 37.5 mg by mouth every morning.   Yes Historical Provider, MD  albuterol (PROVENTIL HFA;VENTOLIN HFA) 108 (90 Base) MCG/ACT inhaler Inhale 1-2 puffs into the lungs every 6 (six) hours as needed for wheezing or shortness of breath. 10/30/16   Nora Rooke C Kelleen Stolze, PA-C  albuterol (PROVENTIL) (5 MG/ML) 0.5% nebulizer solution Take 0.5 mLs (2.5 mg total) by nebulization every 6 (six) hours as needed for wheezing or shortness of breath. 02/13/15   Joni Reining Pisciotta, PA-C  albuterol (PROVENTIL) (5 MG/ML) 0.5% nebulizer solution Take 1 mL (5 mg total) by nebulization  every 6 (six) hours as needed for wheezing or shortness of breath. 10/30/16   Ulisses Vondrak C Jaylyn Iyer, PA-C  benzonatate (TESSALON) 100 MG capsule Take 1 capsule (100 mg total) by mouth every 8 (eight) hours. 10/30/16   Duanne Duchesne C Emmette Katt, PA-C  budesonide (PULMICORT) 180 MCG/ACT inhaler Inhale 2 puffs into the lungs 2 (two) times daily.    Historical Provider, MD  oseltamivir (TAMIFLU) 75 MG capsule Take 1 capsule (75 mg total) by mouth every 12 (twelve) hours. 10/30/16   Lavontay Kirk C Zenas Santa, PA-C  predniSONE (STERAPRED UNI-PAK 21 TAB) 10 MG (21) TBPK tablet Take 6 tabs day 1, 5 tabs day 2, 4 tabs day 3, 3 tabs day 4, 2 tabs day 5, and 1 tab on day 6. 10/30/16   Shavanna Furnari C Jaloni Sorber, PA-C  Prenatal Vit-Fe Fumarate-FA (PRENATAL MULTIVITAMIN) TABS tablet Take 1 tablet by mouth daily at 12 noon.    Historical Provider, MD    Family History Family History  Problem Relation Age of Onset  . Hyperlipidemia Father     Social History Social History  Substance Use Topics  . Smoking status: Current Every Day Smoker    Packs/day: 0.50    Years: 7.00    Types: Cigarettes  . Smokeless tobacco: Never Used  . Alcohol use No     Allergies   Patient  has no known allergies.   Review of Systems Review of Systems  Constitutional: Negative for chills, diaphoresis and fever.  HENT: Positive for congestion and sore throat. Negative for drooling and trouble swallowing.   Respiratory: Positive for cough. Negative for shortness of breath.   Cardiovascular: Negative for chest pain.  Gastrointestinal: Negative for diarrhea, nausea and vomiting.  Neurological: Positive for headaches.  All other systems reviewed and are negative.    Physical Exam Updated Vital Signs BP 115/71 (BP Location: Right Arm)   Pulse 108   Temp 98.3 F (36.8 C) (Oral)   Resp 18   Ht 5\' 8"  (1.727 m)   Wt 122.5 kg   SpO2 100%   Breastfeeding? Unknown   BMI 41.05 kg/m   Physical Exam  Constitutional: She appears well-developed and well-nourished. No distress.    HENT:  Head: Normocephalic and atraumatic.  Mouth/Throat: Uvula is midline and mucous membranes are normal. Posterior oropharyngeal edema and posterior oropharyngeal erythema present. No oropharyngeal exudate.  Eyes: Conjunctivae are normal.  Neck: Normal range of motion. Neck supple.  Cardiovascular: Normal rate, regular rhythm, normal heart sounds and intact distal pulses.   Pulmonary/Chest: Effort normal. No respiratory distress. She has wheezes.  Very minor, generalized wheezes. No increased work of breathing. Patient speaks in full sentences without difficulty.  Abdominal: Soft. There is no tenderness. There is no guarding.  Musculoskeletal: She exhibits no edema.  Lymphadenopathy:    She has no cervical adenopathy.  Neurological: She is alert.  Skin: Skin is warm and dry. She is not diaphoretic.  Psychiatric: She has a normal mood and affect. Her behavior is normal.  Nursing note and vitals reviewed.    ED Treatments / Results  Labs (all labs ordered are listed, but only abnormal results are displayed) Labs Reviewed - No data to display  EKG  EKG Interpretation None       Radiology No results found.  Procedures Procedures (including critical care time)  Medications Ordered in ED Medications  ipratropium-albuterol (DUONEB) 0.5-2.5 (3) MG/3ML nebulizer solution 3 mL (3 mLs Nebulization Given 10/30/16 1125)  predniSONE (DELTASONE) tablet 60 mg (60 mg Oral Given 10/30/16 1118)  albuterol (PROVENTIL HFA;VENTOLIN HFA) 108 (90 Base) MCG/ACT inhaler 2 puff (2 puffs Inhalation Given 10/30/16 1128)     Initial Impression / Assessment and Plan / ED Course  I have reviewed the triage vital signs and the nursing notes.  Pertinent labs & imaging results that were available during my care of the patient were reviewed by me and considered in my medical decision making (see chart for details).      Patient presents with symptoms consistent with URI versus influenza. Patient is  nontoxic appearing. No indication for imaging or further testing at this time. Home care and return precautions discussed. Patient advised to follow-up with her PCP as soon as possible. Patient voices understanding of all instructions and is comfortable with discharge.  Vitals:   10/30/16 1105 10/30/16 1106 10/30/16 1126 10/30/16 1157  BP: 115/71   (!) 105/52  Pulse: 108   85  Resp: 18   18  Temp: 98.3 F (36.8 C)     TempSrc: Oral     SpO2: 100%  97% 99%  Weight:  122.5 kg    Height:  5\' 8"  (1.727 m)      Final Clinical Impressions(s) / ED Diagnoses   Final diagnoses:  Influenza-like illness    New Prescriptions Discharge Medication List as of 10/30/2016 11:53 AM  START taking these medications   Details  !! albuterol (PROVENTIL) (5 MG/ML) 0.5% nebulizer solution Take 1 mL (5 mg total) by nebulization every 6 (six) hours as needed for wheezing or shortness of breath., Starting Sun 10/30/2016, Print    benzonatate (TESSALON) 100 MG capsule Take 1 capsule (100 mg total) by mouth every 8 (eight) hours., Starting Sun 10/30/2016, Print    oseltamivir (TAMIFLU) 75 MG capsule Take 1 capsule (75 mg total) by mouth every 12 (twelve) hours., Starting Sun 10/30/2016, Print    predniSONE (STERAPRED UNI-PAK 21 TAB) 10 MG (21) TBPK tablet Take 6 tabs day 1, 5 tabs day 2, 4 tabs day 3, 3 tabs day 4, 2 tabs day 5, and 1 tab on day 6., Print     !! - Potential duplicate medications found. Please discuss with provider.       Anselm PancoastShawn C Nyanna Heideman, PA-C 10/30/16 1722    Geoffery Lyonsouglas Delo, MD 11/03/16 [phone]

## 2016-10-30 NOTE — Discharge Instructions (Signed)
Your symptoms are consistent with a viral illness, including the flu. Viruses do not require antibiotics. Treatment is symptomatic care and it is important to note that these symptoms may last for 7-14 days. Symptoms will be intensified and complicated by dehydration. Dehydration can also extend the duration of symptoms. Drink plenty of fluids and get plenty of rest. You should be drinking at least half a liter of water an hour to stay hydrated. Electrolyte drinks are also encouraged. You should be drinking enough fluids to make your urine light yellow, almost clear. If this is not the case, you are not drinking enough water. Ibuprofen, Naproxen, or Tylenol for pain or fever. Tessalon for cough. Plain Mucinex may help relieve congestion. Saline sinus rinses and saline nasal sprays may also help relieve congestion. Warm liquids or Chloraseptic spray may help soothe a sore throat. Follow up with a primary care provider, as needed, for any future management of this issue.

## 2017-01-13 ENCOUNTER — Other Ambulatory Visit: Payer: Self-pay

## 2017-01-13 DIAGNOSIS — I83893 Varicose veins of bilateral lower extremities with other complications: Secondary | ICD-10-CM

## 2017-01-17 ENCOUNTER — Encounter: Payer: Self-pay | Admitting: Vascular Surgery

## 2017-01-20 ENCOUNTER — Ambulatory Visit (HOSPITAL_COMMUNITY)
Admission: RE | Admit: 2017-01-20 | Discharge: 2017-01-20 | Disposition: A | Discharge status: Home / Self Care | Payer: Medicaid Other | Source: Ambulatory Visit | Attending: Vascular Surgery | Admitting: Vascular Surgery

## 2017-01-20 ENCOUNTER — Encounter (HOSPITAL_COMMUNITY): Payer: Self-pay

## 2017-01-20 DIAGNOSIS — I83893 Varicose veins of bilateral lower extremities with other complications: Secondary | ICD-10-CM

## 2017-01-24 ENCOUNTER — Encounter: Payer: Self-pay | Admitting: Vascular Surgery

## 2017-01-24 ENCOUNTER — Ambulatory Visit (INDEPENDENT_AMBULATORY_CARE_PROVIDER_SITE_OTHER): Payer: Medicaid Other | Admitting: Vascular Surgery

## 2017-01-24 VITALS — BP 124/83 | HR 88 | Temp 97.2°F | Resp 18 | Ht 68.0 in | Wt 266.8 lb

## 2017-01-24 DIAGNOSIS — I83891 Varicose veins of right lower extremities with other complications: Secondary | ICD-10-CM

## 2017-01-24 NOTE — Progress Notes (Signed)
Subjective:     Patient ID: Kelsey Hartman, female   DOB: [birth year], 31 y.o.   MRN: [id number]  HPI This 31 year old female is status post laser ablation right great saphenous vein with multiple stab phlebectomy by me 4 years ago. She returns for further evaluation. 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 This has continued to progress since that time. We do not have a follow-up ultrasound from the first procedure since she did not keep that appointment. She is currently having aching throbbing burning discomfort as well as heaviness and stinging and itching and these bulging varicosities in the thigh and calf. She does not were elastic compression stockings on a regular basis nor elevate her legs.  Past Medical History:  Diagnosis Date  . Asthma   . Varicose veins     Social History  Substance Use Topics  . Smoking status: Current Every Day Smoker    Packs/day: 0.50    Years: 7.00    Types: Cigarettes  . Smokeless tobacco: Never Used  . Alcohol use No    Family History  Problem Relation Age of Onset  . Hyperlipidemia Father     No Known Allergies   Current Outpatient Prescriptions:  .  albuterol (PROVENTIL HFA;VENTOLIN HFA) 108 (90 Base) MCG/ACT inhaler, Inhale 1-2 puffs into the lungs every 6 (six) hours as needed for wheezing or shortness of breath., Disp: 1 Inhaler, Rfl: 1 .  phentermine 37.5 MG capsule, Take 37.5 mg by mouth every morning., Disp: , Rfl:  .  albuterol (PROVENTIL) (5 MG/ML) 0.5% nebulizer solution, Take 0.5 mLs (2.5 mg total) by nebulization every 6 (six) hours as needed for wheezing or shortness of breath. (Patient not taking: Reported on 01/24/2017), Disp: 20 mL, Rfl: 12 .  albuterol (PROVENTIL) (5 MG/ML) 0.5% nebulizer solution, Take 1 mL (5 mg total) by nebulization every 6 (six) hours as needed for wheezing or shortness of breath. (Patient not taking: Reported on 01/24/2017), Disp:  20 mL, Rfl: 2 .  benzonatate (TESSALON) 100 MG capsule, Take 1 capsule (100 mg total) by mouth every 8 (eight) hours. (Patient not taking: Reported on 01/24/2017), Disp: 21 capsule, Rfl: 0 .  budesonide (PULMICORT) 180 MCG/ACT inhaler, Inhale 2 puffs into the lungs 2 (two) times daily., Disp: , Rfl:  .  oseltamivir (TAMIFLU) 75 MG capsule, Take 1 capsule (75 mg total) by mouth every 12 (twelve) hours. (Patient not taking: Reported on 01/24/2017), Disp: 10 capsule, Rfl: 0 .  predniSONE (STERAPRED UNI-PAK 21 TAB) 10 MG (21) TBPK tablet, Take 6 tabs day 1, 5 tabs day 2, 4 tabs day 3, 3 tabs day 4, 2 tabs day 5, and 1 tab on day 6. (Patient not taking: Reported on 01/24/2017), Disp: 21 tablet, Rfl: 0 .  Prenatal Vit-Fe Fumarate-FA (PRENATAL MULTIVITAMIN) TABS tablet, Take 1 tablet by mouth daily at 12 noon., Disp: , Rfl:   Vitals:   01/24/17 1155  BP: 124/83  Pulse: 88  Resp: 18  Temp: 97.2 F (36.2 C)  TempSrc: Oral  SpO2: 100%  Weight: 266 lb 12.8 oz (121 kg)  Height:  (1.727 m)    Body mass index is 40.57 kg/m.         Review of Systems Denies chest pain but does have occasional dyspnea on exertion. Continues to smoke one half pack of cigarettes per day. Has chronic obesity. Also has asthma requiring inhalers at times. Other systems negative  and complete review of systems    Objective:   Physical Exam BP 124/83 (BP Location: Left Arm, Patient Position: Sitting, Cuff Size: Normal)   Pulse 88   Temp 97.2 F (36.2 C) (Oral)   Resp 18   Ht  (1.727 m)   Wt 266 lb 12.8 oz (121 kg)   SpO2 100%   BMI 40.57 kg/m     Gen.-alert and oriented x3 in no apparent distress-obese HEENT normal for age Lungs no rhonchi or wheezing Cardiovascular regular rhythm no murmurs carotid pulses 3+ palpable no bruits audible Abdomen soft nontender no palpable masses-obese Musculoskeletal free of  major deformities Skin clear -no rashes Neurologic normal Lower extremities 3+ femoral and  dorsalis pedis pulses palpable bilaterally with 1+ edema on the right Large bulging varicosities beginning in the mid thigh anteriorly extending lateral to the knee and into the lateral calf area. No ulceration noted. No hyperpigmentation in the ankle area on the right. 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

## 2017-01-25 NOTE — Addendum Note (Signed)
Addended by: Burton Apley A on: 01/25/2017 09:15 AM   Modules accepted: Orders

## 2017-02-23 ENCOUNTER — Telehealth: Payer: Self-pay | Admitting: *Deleted

## 2017-02-23 NOTE — Telephone Encounter (Signed)
Pt called in saying her leg pain and bulging varicose veins are much worse. She was wondering if she could come in sooner than another two months from now (Her new 3 mo trail of conserv tx started 01/24/17). I told her insurance required three months and that that can't be shortened unless an ulcer develops or there is bleeding from a vein. Told her to cont to wear the stockings and to take Ibuprofen for the pain. She expressed understanding. We will follow her prn.

## 2017-04-17 ENCOUNTER — Encounter: Payer: Self-pay | Admitting: Vascular Surgery

## 2017-05-01 ENCOUNTER — Other Ambulatory Visit: Payer: Self-pay | Admitting: Allergy

## 2017-05-01 ENCOUNTER — Ambulatory Visit: Payer: Medicaid Other | Admitting: Vascular Surgery

## 2017-05-01 ENCOUNTER — Inpatient Hospital Stay (HOSPITAL_COMMUNITY): Admission: RE | Admit: 2017-05-01 | Payer: Medicaid Other | Source: Ambulatory Visit

## 2017-05-01 ENCOUNTER — Encounter: Payer: Self-pay | Admitting: Vascular Surgery

## 2017-05-01 NOTE — Telephone Encounter (Signed)
Denied refill for pro air. Patient needs office visit.

## 2017-05-02 ENCOUNTER — Other Ambulatory Visit: Payer: Self-pay | Admitting: *Deleted

## 2017-05-02 ENCOUNTER — Ambulatory Visit (INDEPENDENT_AMBULATORY_CARE_PROVIDER_SITE_OTHER): Payer: Medicaid Other | Admitting: Vascular Surgery

## 2017-05-02 ENCOUNTER — Encounter: Payer: Self-pay | Admitting: Vascular Surgery

## 2017-05-02 ENCOUNTER — Ambulatory Visit (HOSPITAL_COMMUNITY)
Admission: RE | Admit: 2017-05-02 | Discharge: 2017-05-02 | Disposition: A | Discharge status: Home / Self Care | Payer: Medicaid Other | Source: Ambulatory Visit | Attending: Vascular Surgery | Admitting: Vascular Surgery

## 2017-05-02 VITALS — BP 99/72 | HR 66 | Temp 97.9°F | Resp 18 | Ht 68.0 in | Wt 253.0 lb

## 2017-05-02 DIAGNOSIS — I83891 Varicose veins of right lower extremities with other complications: Secondary | ICD-10-CM | POA: Insufficient documentation

## 2017-05-02 NOTE — Progress Notes (Signed)
Subjective:     Patient ID: Kelsey Hartman, female   DOB: [birth year], 31 y.o.   MRN: [id number]  HPI This 31 year old female returns for 3 month follow-up regarding her severe painful varicosities in the right leg. She had previous laser ablation performed 4 years ago by me but never returned for follow-up. She was free of symptoms 4. A few years and then these began to recur and now she is found to have a large trunk of the great saphenous vein supplying these painful varicosities P she's tried long-leg elastic compression stockings 20-30 millimeter gradient as well as elevation and ibuprofen but continues to have aching throbbing and burning discomfort throughout the day. Her symptoms are affecting her daily living. She also developed swelling below the knee.  Past Medical History:  Diagnosis Date  . Asthma   . Varicose veins     Social History  Substance Use Topics  . Smoking status: Current Every Day Smoker    Packs/day: 0.50    Years: 7.00    Types: Cigarettes  . Smokeless tobacco: Never Used  . Alcohol use No    Family History  Problem Relation Age of Onset  . Hyperlipidemia Father     No Known Allergies   Current Outpatient Prescriptions:  .  albuterol (PROVENTIL HFA;VENTOLIN HFA) 108 (90 Base) MCG/ACT inhaler, Inhale 1-2 puffs into the lungs every 6 (six) hours as needed for wheezing or shortness of breath., Disp: 1 Inhaler, Rfl: 1 .  phentermine 37.5 MG capsule, Take 37.5 mg by mouth every morning., Disp: , Rfl:  .  albuterol (PROVENTIL) (5 MG/ML) 0.5% nebulizer solution, Take 0.5 mLs (2.5 mg total) by nebulization every 6 (six) hours as needed for wheezing or shortness of breath. (Patient not taking: Reported on 01/24/2017), Disp: 20 mL, Rfl: 12 .  albuterol (PROVENTIL) (5 MG/ML) 0.5% nebulizer solution, Take 1 mL (5 mg total) by nebulization every 6 (six) hours as needed for wheezing or shortness of breath. (Patient not taking: Reported on 01/24/2017), Disp: 20 mL, Rfl: 2 .   benzonatate (TESSALON) 100 MG capsule, Take 1 capsule (100 mg total) by mouth every 8 (eight) hours. (Patient not taking: Reported on 01/24/2017), Disp: 21 capsule, Rfl: 0 .  budesonide (PULMICORT) 180 MCG/ACT inhaler, Inhale 2 puffs into the lungs 2 (two) times daily., Disp: , Rfl:  .  oseltamivir (TAMIFLU) 75 MG capsule, Take 1 capsule (75 mg total) by mouth every 12 (twelve) hours. (Patient not taking: Reported on 01/24/2017), Disp: 10 capsule, Rfl: 0 .  predniSONE (STERAPRED UNI-PAK 21 TAB) 10 MG (21) TBPK tablet, Take 6 tabs day 1, 5 tabs day 2, 4 tabs day 3, 3 tabs day 4, 2 tabs day 5, and 1 tab on day 6. (Patient not taking: Reported on 01/24/2017), Disp: 21 tablet, Rfl: 0 .  Prenatal Vit-Fe Fumarate-FA (PRENATAL MULTIVITAMIN) TABS tablet, Take 1 tablet by mouth daily at 12 noon., Disp: , Rfl:   Vitals:   05/02/17 1330  BP: 99/72  Pulse: 66  Resp: 18  Temp: 97.9 F (36.6 C)  TempSrc: Oral  SpO2: 100%  Weight: 253 lb (114.8 kg)  Height: 5\' 8"  (1.727 m)    Body mass index is 38.47 kg/m.         Review of Systems Denies chest pain, dyspnea on exertion, PND, orthopnea, hemoptysis    Objective:   Physical Exam BP 99/72 (BP Location: Left Arm, Patient Position: Sitting, Cuff Size: Normal)   Pulse 66   Temp 97.9 F (  36.6 C) (Oral)   Resp 18   Ht 5\' 8"  (1.727 m)   Wt 253 lb (114.8 kg)   SpO2 100%   BMI 38.47 kg/m   Gen. obese female no apparent distress alert and oriented 3 Lungs no rhonchi or wheezing Right leg with extensive bulging varicosities beginning in the mid thigh anteriorly extending laterally and medially into the calf area with 1+ distal edema. No active ulcer noted. No hyperpigmentation noted.  Today I ordered a venous reflux exam which I reviewed and interpreted. There is no DVT. There is gross reflux and a quite large great saphenous vein in the upper half of the right thigh which supplies these painful varicosities  I confirmed these findings with a  bedside SonoSite ultrasound exam    Assessment:     #1 recurrent painful varicosities right leg with swelling due to gross reflux and large right great saphenous vein and upper half right leg causing symptoms which are affecting patient's daily living and resistant to conservative measures including long-leg elastic compression stockings 20-30 millimeter gradient, elevation, and ibuprofen    Plan:     Patient needs laser ablation right great saphenous vein plus greater than 20 stab phlebectomy of painful varicosities We will proceed in the very near future to hopefully relieve her symptoms

## 2017-05-05 ENCOUNTER — Encounter: Payer: Self-pay | Admitting: Vascular Surgery

## 2017-05-08 ENCOUNTER — Other Ambulatory Visit: Payer: Medicaid Other | Admitting: Vascular Surgery

## 2017-05-12 ENCOUNTER — Encounter: Payer: Self-pay | Admitting: Vascular Surgery

## 2017-05-15 ENCOUNTER — Ambulatory Visit: Payer: Medicaid Other | Admitting: Vascular Surgery

## 2017-05-15 ENCOUNTER — Encounter (HOSPITAL_COMMUNITY): Payer: Medicaid Other

## 2017-05-16 ENCOUNTER — Encounter (HOSPITAL_COMMUNITY): Payer: Medicaid Other

## 2017-05-16 ENCOUNTER — Ambulatory Visit: Payer: Medicaid Other | Admitting: Vascular Surgery

## 2017-05-16 ENCOUNTER — Other Ambulatory Visit: Payer: Medicaid Other | Admitting: Vascular Surgery

## 2017-05-23 ENCOUNTER — Ambulatory Visit: Payer: Medicaid Other | Admitting: Vascular Surgery

## 2017-05-23 ENCOUNTER — Encounter (HOSPITAL_COMMUNITY): Payer: Medicaid Other

## 2017-05-24 ENCOUNTER — Encounter: Payer: Self-pay | Admitting: Vascular Surgery

## 2017-05-30 ENCOUNTER — Ambulatory Visit (INDEPENDENT_AMBULATORY_CARE_PROVIDER_SITE_OTHER): Payer: Medicaid Other | Admitting: Vascular Surgery

## 2017-05-30 ENCOUNTER — Encounter: Payer: Self-pay | Admitting: Vascular Surgery

## 2017-05-30 VITALS — BP 102/68 | HR 69 | Temp 97.4°F | Resp 18 | Ht 68.0 in | Wt 254.0 lb

## 2017-05-30 DIAGNOSIS — I83891 Varicose veins of right lower extremities with other complications: Secondary | ICD-10-CM | POA: Diagnosis not present

## 2017-05-30 HISTORY — PX: ENDOVENOUS ABLATION SAPHENOUS VEIN W/ LASER: SUR449

## 2017-05-30 NOTE — Progress Notes (Signed)
Laser Ablation Procedure    Date: 05/30/2017   Kelsey Hartman DOB:May 22, 1986  Consent signed: Yes    Surgeon:  Dr. Quita SkyeJames D. Hart RochesterLawson  Procedure: Laser Ablation: right Greater Saphenous Vein  BP 102/68 (BP Location: Left Arm, Patient Position: Sitting, Cuff Size: Large)   Pulse 69   Temp (!) 97.4 F (36.3 C) (Oral)   Resp 18   Ht 5\' 8"  (1.727 m)   Wt 254 lb (115.2 kg)   SpO2 96%   BMI 38.62 kg/m   Tumescent Anesthesia: 475 cc 0.9% NaCl with 50 cc Lidocaine HCL with 1% Epi and 15 cc 8.4% NaHCO3  Local Anesthesia: 8 cc Lidocaine HCL and NaHCO3 (ratio 2:1)  Pulsed Mode: 17 watts, 500ms delay, 1.0 duration  Total Energy: 1136 Joules             Total Pulses: 67               Total Time: 1:07    Stab Phlebectomy: > 20 Sites: Thigh and Calf  Patient tolerated procedure well    Description of Procedure:  After marking the course of the secondary varicosities, the patient was placed on the operating table in the supine position, and the right leg was prepped and draped in sterile fashion.   Local anesthetic was administered and under ultrasound guidance the saphenous vein was accessed with a micro needle and guide wire; then the mirco puncture sheath was placed.  A guide wire was inserted saphenofemoral junction , followed by a 5 french sheath.  The position of the sheath and then the laser fiber below the junction was confirmed using the ultrasound.  Tumescent anesthesia was administered along the course of the saphenous vein using ultrasound guidance. The patient was placed in Trendelenburg position and protective laser glasses were placed on patient and staff, and the laser was fired at 15 watts continuous mode advancing 1-422mm/second for a total of 1136 joules.   For stab phlebectomies, local anesthetic was administered at the previously marked varicosities, and tumescent anesthesia was administered around the vessels.  Greater than 20 stab wounds were made using the tip of an 11 blade.  And using the vein hook, the phlebectomies were performed using a hemostat to avulse the varicosities.  Adequate hemostasis was achieved.     Steri strips were applied to the stab wounds and ABD pads and thigh high compression stockings were applied.  Ace wrap bandages were applied over the phlebectomy sites and at the top of the saphenofemoral junction. Blood loss was less than 15 cc.  The patient ambulated out of the operating room having tolerated the procedure well.

## 2017-05-30 NOTE — Progress Notes (Signed)
Subjective:     Patient ID: Kelsey Hartman, female   DOB: June 21, 1986, 31 y.o.   MRN: [id number]  HPI this 31 year old female had laser ablation of the right great saphenous vein from the distal thigh to near the saphenofemoral junction plus greater than 20 stab phlebectomy of painful varicosities performed under local tumescent anesthesia. A total of 1136 J of energy was utilized. She tolerated the procedure well.   Review of Systems     Objective:   Physical Exam BP 102/68 (BP Location: Left Arm, Patient Position: Sitting, Cuff Size: Large)   Pulse 69   Temp (!) 97.4 F (36.3 C) (Oral)   Resp 18   Ht 5\' 8"  (1.727 m)   Wt 254 lb (115.2 kg)   SpO2 96%   BMI 38.62 kg/m        Assessment:     Well-tolerated laser ablation right great saphenous vein plus greater than 20 stab phlebectomy of painful varicosities performed under local tumescent anesthesia    Plan:     Return in 1 week for venous duplex exam to confirm closure right great saphenous vein and this will complete patient's treatment regimen

## 2017-05-31 ENCOUNTER — Encounter: Payer: Self-pay | Admitting: Vascular Surgery

## 2017-06-02 ENCOUNTER — Encounter: Payer: Self-pay | Admitting: Vascular Surgery

## 2017-06-05 ENCOUNTER — Encounter: Payer: Self-pay | Admitting: Vascular Surgery

## 2017-06-05 ENCOUNTER — Ambulatory Visit (HOSPITAL_COMMUNITY)
Admission: RE | Admit: 2017-06-05 | Discharge: 2017-06-05 | Disposition: A | Discharge status: Home / Self Care | Payer: Medicaid Other | Source: Ambulatory Visit | Attending: Vascular Surgery | Admitting: Vascular Surgery

## 2017-06-05 ENCOUNTER — Ambulatory Visit (INDEPENDENT_AMBULATORY_CARE_PROVIDER_SITE_OTHER): Payer: Self-pay | Admitting: Vascular Surgery

## 2017-06-05 VITALS — BP 121/84 | HR 69 | Temp 98.6°F | Resp 18 | Ht 68.0 in | Wt 254.0 lb

## 2017-06-05 DIAGNOSIS — I83891 Varicose veins of right lower extremities with other complications: Secondary | ICD-10-CM

## 2017-06-05 NOTE — Progress Notes (Signed)
Subjective:     Patient ID: Kelsey Hartman, female   DOB: 1986/06/11, 31 y.o.   MRN: [id number]  HPI This 31 year old female returns 1 week post-laser ablation right great saphenous vein plus greater than 20 stab phlebectomy of painful varicosities. She has no complaints today. She has worn her elastic compression stocking and taken ibuprofen as instructed. She has had some moderate discomfort in the medial aspect of the proximal thigh as one would expect that that is beginning to resolve.  Past Medical History:  Diagnosis Date  . Asthma   . Varicose veins     Social History  Substance Use Topics  . Smoking status: Current Every Day Smoker    Packs/day: 0.50    Years: 7.00    Types: Cigarettes  . Smokeless tobacco: Never Used  . Alcohol use No    Family History  Problem Relation Age of Onset  . Hyperlipidemia Father     No Known Allergies   Current Outpatient Prescriptions:  .  albuterol (PROVENTIL HFA;VENTOLIN HFA) 108 (90 Base) MCG/ACT inhaler, Inhale 1-2 puffs into the lungs every 6 (six) hours as needed for wheezing or shortness of breath., Disp: 1 Inhaler, Rfl: 1 .  albuterol (PROVENTIL) (5 MG/ML) 0.5% nebulizer solution, Take 0.5 mLs (2.5 mg total) by nebulization every 6 (six) hours as needed for wheezing or shortness of breath., Disp: 20 mL, Rfl: 12 .  albuterol (PROVENTIL) (5 MG/ML) 0.5% nebulizer solution, Take 1 mL (5 mg total) by nebulization every 6 (six) hours as needed for wheezing or shortness of breath., Disp: 20 mL, Rfl: 2 .  benzonatate (TESSALON) 100 MG capsule, Take 1 capsule (100 mg total) by mouth every 8 (eight) hours., Disp: 21 capsule, Rfl: 0 .  budesonide (PULMICORT) 180 MCG/ACT inhaler, Inhale 2 puffs into the lungs 2 (two) times daily., Disp: , Rfl:  .  oseltamivir (TAMIFLU) 75 MG capsule, Take 1 capsule (75 mg total) by mouth every 12 (twelve) hours., Disp: 10 capsule, Rfl: 0 .  phentermine 37.5 MG capsule, Take 37.5 mg by mouth every morning., Disp:  , Rfl:  .  predniSONE (STERAPRED UNI-PAK 21 TAB) 10 MG (21) TBPK tablet, Take 6 tabs day 1, 5 tabs day 2, 4 tabs day 3, 3 tabs day 4, 2 tabs day 5, and 1 tab on day 6., Disp: 21 tablet, Rfl: 0 .  Prenatal Vit-Fe Fumarate-FA (PRENATAL MULTIVITAMIN) TABS tablet, Take 1 tablet by mouth daily at 12 noon., Disp: , Rfl:   Vitals:   06/05/17 1038  BP: 121/84  Pulse: 69  Resp: 18  Temp: 98.6 F (37 C)  TempSrc: Oral  SpO2: 100%  Weight: 254 lb (115.2 kg)  Height:  (1.727 m)    Body mass index is 38.62 kg/m.          Review of Systems     Objective:   Physical Exam BP 121/84 (BP Location: Left Arm, Patient Position: Sitting, Cuff Size: Normal)   Pulse 69   Temp 98.6 F (37 C) (Oral)   Resp 18   Ht  (1.727 m)   Wt 254 lb (115.2 kg)   SpO2 100%   BMI 38.62 kg/m   Gen. well-developed obese female no apparent distress alert and oriented 3 Right leg with nicely healing stab phlebectomy site and thigh and calf. Minimal distal edema noted. 3 posterior cells pedis pulse palpable. Mild tenderness to deep palpation over medial thigh and great saphenous vein area  Today I ordered a venous  duplex exam the right leg which I reviewed and interpreted. There is no DVT. There is total closure of the right great saphenous vein up to the saphenofemoral junction.     Assessment:     Successful laser ablation right great saphenous vein with multiple stab phlebectomy of painful varicosities    Plan:     Patient will wear elastic compression stocking for 1 more week, discontinued ibuprofen therapy today, return to see me on a when necessary basis

## 2017-12-03 ENCOUNTER — Encounter (HOSPITAL_BASED_OUTPATIENT_CLINIC_OR_DEPARTMENT_OTHER): Payer: Self-pay | Admitting: Emergency Medicine

## 2017-12-03 ENCOUNTER — Emergency Department (HOSPITAL_BASED_OUTPATIENT_CLINIC_OR_DEPARTMENT_OTHER)
Admission: EM | Admit: 2017-12-03 | Discharge: 2017-12-03 | Disposition: A | Discharge status: Home / Self Care | Payer: Medicaid Other | Attending: Emergency Medicine | Admitting: Emergency Medicine

## 2017-12-03 ENCOUNTER — Other Ambulatory Visit: Payer: Self-pay

## 2017-12-03 DIAGNOSIS — Z5321 Procedure and treatment not carried out due to patient leaving prior to being seen by health care provider: Secondary | ICD-10-CM | POA: Diagnosis not present

## 2017-12-03 DIAGNOSIS — M546 Pain in thoracic spine: Secondary | ICD-10-CM | POA: Insufficient documentation

## 2017-12-03 NOTE — ED Notes (Signed)
Pt called for room, no answer.

## 2017-12-03 NOTE — ED Triage Notes (Signed)
R thoracic back pain x 2 days after moving furniture. [facility] Given muscle relaxer and pt reports no relief.

## 2017-12-03 NOTE — ED Notes (Signed)
Pt called for room, no response. 

## 2018-03-22 ENCOUNTER — Other Ambulatory Visit: Payer: Self-pay

## 2018-03-22 ENCOUNTER — Emergency Department (HOSPITAL_BASED_OUTPATIENT_CLINIC_OR_DEPARTMENT_OTHER)
Admission: EM | Admit: 2018-03-22 | Discharge: 2018-03-22 | Disposition: A | Discharge status: Home / Self Care | Payer: Medicaid Other | Attending: Emergency Medicine | Admitting: Emergency Medicine

## 2018-03-22 ENCOUNTER — Encounter (HOSPITAL_BASED_OUTPATIENT_CLINIC_OR_DEPARTMENT_OTHER): Payer: Self-pay | Admitting: Emergency Medicine

## 2018-03-22 DIAGNOSIS — F17210 Nicotine dependence, cigarettes, uncomplicated: Secondary | ICD-10-CM | POA: Insufficient documentation

## 2018-03-22 DIAGNOSIS — H5789 Other specified disorders of eye and adnexa: Secondary | ICD-10-CM | POA: Diagnosis present

## 2018-03-22 DIAGNOSIS — J45909 Unspecified asthma, uncomplicated: Secondary | ICD-10-CM | POA: Insufficient documentation

## 2018-03-22 DIAGNOSIS — Z79899 Other long term (current) drug therapy: Secondary | ICD-10-CM | POA: Diagnosis not present

## 2018-03-22 DIAGNOSIS — H1132 Conjunctival hemorrhage, left eye: Secondary | ICD-10-CM | POA: Insufficient documentation

## 2018-03-22 NOTE — ED Triage Notes (Signed)
Reports ruptured blood vessel to left eye @ 1500 today.  Reports she now has blurred vision.  Ambulatory to triage in NAD.

## 2018-03-22 NOTE — ED Provider Notes (Signed)
[facility] EMERGENCY DEPARTMENT Provider Note   CSN: [id number] Arrival date & time: 03/22/18  1856     History   Chief Complaint Chief Complaint  Patient presents with  . Eye Problem    HPI Kelsey Hartman is a 32 y.o. female.  Patient is a 31 year old female with a history of asthma but no other significant medical history presenting today with redness of her left eye.  Patient states that she felt like her eye did not feel normal and looked in the mirror and noticed that it was red.  She also felt like there was some soreness and swelling of her upper lid.  She states her vision does not seem quite right and she just does not feel quite right.  She states she has become very anxious and nervous because she is not sure what is going on.  She denies any drainage or trauma to the eye.  She does not take any anticoagulation and denies any recent falls.  She has not been straining or doing any heavy lifting.  The history is provided by the patient.  Eye Problem   This is a new problem. The current episode started 3 to 5 hours ago. The problem occurs constantly. The problem has not changed since onset.There is a problem in the left eye. There was no injury mechanism. The pain is at a severity of 2/10. The pain is mild. There is no history of trauma to the eye. There is no known exposure to pink eye. She does not wear contacts. Associated symptoms include blurred vision and eye redness. Pertinent negatives include no decreased vision, no discharge, no double vision, no photophobia, no nausea, no vomiting, no weakness and no itching. She has tried nothing for the symptoms. The treatment provided no relief.    Past Medical History:  Diagnosis Date  . Asthma   . Varicose veins     Patient Active Problem List   Diagnosis Date Noted  . Varicose veins of right lower extremity with complications 02/05/2013    Past Surgical History:  Procedure Laterality Date  . BACK SURGERY      . ENDOVENOUS ABLATION SAPHENOUS VEIN W/ LASER Right 05/30/2017   endovenous laser ablation right greater saphenous vein and stab phlebectomy > 20 incisions right leg by Dr. Hart [place]   . uterine surergy at age 35       OB History    Gravida  1   Para      Term      Preterm      AB      Living        SAB      TAB      Ectopic      Multiple      Live Births               Home Medications    Prior to Admission medications   Medication Sig Start Date End Date Taking? Authorizing Provider  albuterol (PROVENTIL HFA;VENTOLIN HFA) 108 (90 Base) MCG/ACT inhaler Inhale 1-2 puffs into the lungs every 6 (six) hours as needed for wheezing or shortness of breath. 10/30/16   Joy, Shawn C, PA-C  albuterol (PROVENTIL) (5 MG/ML) 0.5% nebulizer solution Take 0.5 mLs (2.5 mg total) by nebulization every 6 (six) hours as needed for wheezing or shortness of breath. 02/13/15   Pisciotta, Joni Reining, PA-C  albuterol (PROVENTIL) (5 MG/ML) 0.5% nebulizer solution Take 1 mL (5 mg total) by nebulization every 6 (  six) hours as needed for wheezing or shortness of breath. 10/30/16   Joy, Shawn C, PA-C  benzonatate (TESSALON) 100 MG capsule Take 1 capsule (100 mg total) by mouth every 8 (eight) hours. 10/30/16   Joy, Shawn C, PA-C  budesonide (PULMICORT) 180 MCG/ACT inhaler Inhale 2 puffs into the lungs 2 (two) times daily.    [provider]  oseltamivir (TAMIFLU) 75 MG capsule Take 1 capsule (75 mg total) by mouth every 12 (twelve) hours. 10/30/16   Joy, Shawn C, PA-C  phentermine 37.5 MG capsule Take 37.5 mg by mouth every morning.    [provider]  predniSONE (STERAPRED UNI-PAK 21 TAB) 10 MG (21) TBPK tablet Take 6 tabs day 1, 5 tabs day 2, 4 tabs day 3, 3 tabs day 4, 2 tabs day 5, and 1 tab on day 6. 10/30/16   Joy, Shawn C, PA-C  Prenatal Vit-Fe Fumarate-FA (PRENATAL MULTIVITAMIN) TABS tablet Take 1 tablet by mouth daily at 12 noon.    [provider]    Family History Family  History  Problem Relation Age of Onset  . Hyperlipidemia Father     Social History Social History   Tobacco Use  . Smoking status: Current Every Day Smoker    Packs/day: 0.50    Years: 7.00    Pack years: 3.50    Types: Cigarettes  . Smokeless tobacco: Never Used  Substance Use Topics  . Alcohol use: No  . Drug use: No     Allergies   Patient has no known allergies.   Review of Systems Review of Systems  Eyes: Positive for blurred vision and redness. Negative for double vision, photophobia and discharge.  Gastrointestinal: Negative for nausea and vomiting.  Skin: Negative for itching.  Neurological: Negative for weakness.  All other systems reviewed and are negative.    Physical Exam Updated Vital Signs BP (!) 113/59   Pulse 83   Temp 98.1 F (36.7 C) (Oral)   Resp 18   SpO2 98%   Physical Exam  Constitutional: She is oriented to person, place, and time. She appears well-developed and well-nourished. No distress.  HENT:  Head: Normocephalic and atraumatic.  Eyes: Pupils are equal, round, and reactive to light. EOM are normal. Right eye exhibits no discharge, no exudate and no hordeolum. Left eye exhibits no discharge, no exudate and no hordeolum. Right conjunctiva is not injected. Right conjunctiva has no hemorrhage. Left conjunctiva is not injected. Left conjunctiva has a hemorrhage.    Cardiovascular: Normal rate.  Pulmonary/Chest: Effort normal.  Neurological: She is alert and oriented to person, place, and time.  Skin: Skin is warm and dry.  Psychiatric: She has a normal mood and affect. Her behavior is normal.  Nursing note and vitals reviewed.    ED Treatments / Results  Labs (all labs ordered are listed, but only abnormal results are displayed) Labs Reviewed - No data to display  EKG None  Radiology No results found.  Procedures Procedures (including critical care time)  Medications Ordered in ED Medications - No data to  display   Initial Impression / Assessment and Plan / ED Course  I have reviewed the triage vital signs and the nursing notes.  Pertinent labs & imaging results that were available during my care of the patient were reviewed by me and considered in my medical decision making (see chart for details).    Patient presenting today with symptoms of subconjunctival hemorrhage of the left eye that was spontaneous.  She has no risk factors and has no neurologic symptoms.  She initially stated her vision was blurry however when lifting the left eyelid out of her visual field she states her vision is normal.  She is recently been on phentermine and has not been eating as much and feels most likely that is why she is feeling dizzy.  Her vital signs are reassuring and exam is otherwise normal.  Patient was reassured and discharged home.  Final Clinical Impressions(s) / ED Diagnoses   Final diagnoses:  Subconjunctival hemorrhage of left eye    ED Discharge Orders    None       Gwyneth SproutPlunkett, Mallery Harshman, MD 03/22/18 2003

## 2018-03-22 NOTE — ED Notes (Signed)
Patient claimed that she is feeling dizzy.  Left eye is red and stated that she is also feeling dizzy.

## 2018-03-22 NOTE — ED Notes (Signed)
ED Provider at bedside. 

## 2018-05-09 ENCOUNTER — Encounter (HOSPITAL_BASED_OUTPATIENT_CLINIC_OR_DEPARTMENT_OTHER): Payer: Self-pay | Admitting: Emergency Medicine

## 2018-05-09 ENCOUNTER — Emergency Department (HOSPITAL_BASED_OUTPATIENT_CLINIC_OR_DEPARTMENT_OTHER): Payer: Medicaid Other

## 2018-05-09 ENCOUNTER — Other Ambulatory Visit: Payer: Self-pay

## 2018-05-09 ENCOUNTER — Emergency Department (HOSPITAL_BASED_OUTPATIENT_CLINIC_OR_DEPARTMENT_OTHER)
Admission: EM | Admit: 2018-05-09 | Discharge: 2018-05-09 | Disposition: A | Discharge status: Home / Self Care | Payer: Medicaid Other | Attending: Emergency Medicine | Admitting: Emergency Medicine

## 2018-05-09 DIAGNOSIS — J069 Acute upper respiratory infection, unspecified: Secondary | ICD-10-CM | POA: Diagnosis not present

## 2018-05-09 DIAGNOSIS — F17210 Nicotine dependence, cigarettes, uncomplicated: Secondary | ICD-10-CM | POA: Diagnosis not present

## 2018-05-09 DIAGNOSIS — R0602 Shortness of breath: Secondary | ICD-10-CM | POA: Diagnosis present

## 2018-05-09 DIAGNOSIS — Z79899 Other long term (current) drug therapy: Secondary | ICD-10-CM | POA: Diagnosis not present

## 2018-05-09 DIAGNOSIS — J45909 Unspecified asthma, uncomplicated: Secondary | ICD-10-CM | POA: Diagnosis not present

## 2018-05-09 DIAGNOSIS — B9789 Other viral agents as the cause of diseases classified elsewhere: Secondary | ICD-10-CM

## 2018-05-09 MED ORDER — ALBUTEROL SULFATE HFA 108 (90 BASE) MCG/ACT IN AERS: 1.0000 | INHALATION_SPRAY | Freq: Four times a day (QID) | RESPIRATORY_TRACT | 1 refills | Status: AC | PRN

## 2018-05-09 MED ORDER — ALBUTEROL (5 MG/ML) CONTINUOUS INHALATION SOLN
INHALATION_SOLUTION | RESPIRATORY_TRACT | Status: AC
  Administered 2018-05-09: 3 mg/h
  Filled 2018-05-09: qty 20

## 2018-05-09 MED ORDER — IPRATROPIUM BROMIDE 0.02 % IN SOLN
RESPIRATORY_TRACT | Status: AC
  Administered 2018-05-09: 0.5 mg
  Filled 2018-05-09: qty 2.5

## 2018-05-09 NOTE — ED Notes (Signed)
 15mg  Albuterol complete. Pt states I feel much better.

## 2018-05-09 NOTE — ED Notes (Signed)
Pt/family verbalized understanding of discharge instructions.   

## 2018-05-09 NOTE — ED Provider Notes (Signed)
[facility] EMERGENCY DEPARTMENT Provider Note  CSN: [id number] Arrival date & time: 05/09/18  1105    History   Chief Complaint Chief Complaint  Patient presents with  . Shortness of Breath    HPI Kelsey Hartman is a 32 y.o. female with a medical history of asthma who presented to the ED for SOB x1 day. Associated symptoms: congestion, rhinorrhea, cough and sore throat. Denies fever, chest pain, leg swelling and palpitations. Unknown recent sick contacts. Patient has tried her children's Albuterol nebulizer x2 prior to coming to the ED with minimal relief.  Past Medical History:  Diagnosis Date  . Asthma   . Varicose veins     Patient Active Problem List   Diagnosis Date Noted  . Varicose veins of right lower extremity with complications 02/05/2013    Past Surgical History:  Procedure Laterality Date  . BACK SURGERY    . ENDOVENOUS ABLATION SAPHENOUS VEIN W/ LASER Right 05/30/2017   endovenous laser ablation right greater saphenous vein and stab phlebectomy > 20 incisions right leg by Dr. Hart RochesterLawson   . uterine surergy at age 89       OB History    Gravida  1   Para      Term      Preterm      AB      Living        SAB      TAB      Ectopic      Multiple      Live Births               Home Medications    Prior to Admission medications   Medication Sig Start Date End Date Taking? Authorizing Provider  albuterol (PROVENTIL HFA;VENTOLIN HFA) 108 (90 Base) MCG/ACT inhaler Inhale 1-2 puffs into the lungs every 6 (six) hours as needed for wheezing or shortness of breath. 05/09/18   Manette Doto, Jerrel IvoryGabrielle I, PA-C  albuterol (PROVENTIL) (5 MG/ML) 0.5% nebulizer solution Take 0.5 mLs (2.5 mg total) by nebulization every 6 (six) hours as needed for wheezing or shortness of breath. 02/13/15   Pisciotta, Joni ReiningNicole, PA-C  albuterol (PROVENTIL) (5 MG/ML) 0.5% nebulizer solution Take 1 mL (5 mg total) by nebulization every 6 (six) hours as needed for wheezing or  shortness of breath. 10/30/16   Joy, Shawn C, PA-C  benzonatate (TESSALON) 100 MG capsule Take 1 capsule (100 mg total) by mouth every 8 (eight) hours. 10/30/16   Joy, Shawn C, PA-C  budesonide (PULMICORT) 180 MCG/ACT inhaler Inhale 2 puffs into the lungs 2 (two) times daily.    [provider]  oseltamivir (TAMIFLU) 75 MG capsule Take 1 capsule (75 mg total) by mouth every 12 (twelve) hours. 10/30/16   Joy, Shawn C, PA-C  phentermine 37.5 MG capsule Take 37.5 mg by mouth every morning.    [provider]  predniSONE (STERAPRED UNI-PAK 21 TAB) 10 MG (21) TBPK tablet Take 6 tabs day 1, 5 tabs day 2, 4 tabs day 3, 3 tabs day 4, 2 tabs day 5, and 1 tab on day 6. 10/30/16   Joy, Shawn C, PA-C  Prenatal Vit-Fe Fumarate-FA (PRENATAL MULTIVITAMIN) TABS tablet Take 1 tablet by mouth daily at 12 noon.    [provider]    Family History Family History  Problem Relation Age of Onset  . Hyperlipidemia Father     Social History Social History   Tobacco Use  . Smoking status: Current Every Day Smoker  Packs/day: 0.50    Years: 7.00    Pack years: 3.50    Types: Cigarettes  . Smokeless tobacco: Never Used  Substance Use Topics  . Alcohol use: No  . Drug use: No     Allergies   Patient has no known allergies.   Review of Systems Review of Systems  Constitutional: Negative for chills and fever.  HENT: Positive for congestion, rhinorrhea, sinus pressure, sinus pain and sore throat. Negative for drooling, ear pain, postnasal drip and trouble swallowing.   Respiratory: Positive for shortness of breath. Negative for cough and chest tightness.   Cardiovascular: Negative for chest pain, palpitations and leg swelling.  Gastrointestinal: Negative.   Skin: Negative.   Neurological: Negative for dizziness, light-headedness and headaches.    Physical Exam Updated Vital Signs BP 119/70 (BP Location: Right Arm)   Pulse 86   Temp 98.2 F (36.8 C) (Oral)   Resp 16   Ht 5'  8" (1.727 m)   Wt 117.9 kg   SpO2 100%   BMI 39.53 kg/m   Physical Exam  Constitutional: She appears well-developed and well-nourished. No distress.  HENT:  Mouth/Throat: Oropharynx is clear and moist. No oropharyngeal exudate or posterior oropharyngeal edema.  Eyes: Pupils are equal, round, and reactive to light. EOM are normal.  Neck: Normal range of motion. Neck supple.  Cardiovascular: Normal rate, regular rhythm, normal heart sounds and intact distal pulses.  Pulmonary/Chest: Effort normal and breath sounds normal.  Inspiratory wheezes heard in right upper lung field.  Musculoskeletal:       Right lower leg: Normal. She exhibits no edema.       Left lower leg: Normal. She exhibits no edema.  Skin: Skin is warm and intact. She is not diaphoretic. No cyanosis. No pallor.  Nursing note and vitals reviewed.    ED Treatments / Results  Labs (all labs ordered are listed, but only abnormal results are displayed) Labs Reviewed - No data to display  EKG None  Radiology Dg Chest 2 View  Result Date: 05/09/2018 CLINICAL DATA:  Nonproductive cough, chest congestion, and shortness of breath for 3 days. EXAM: CHEST - 2 VIEW COMPARISON:  06/26/2011 FINDINGS: The heart size and mediastinal contours are within normal limits. Both lungs are clear. The visualized skeletal structures are unremarkable. IMPRESSION: Normal exam. Electronically Signed   By: Francene Boyers M.D.   On: 05/09/2018 12:17    Procedures Procedures (including critical care time)  Medications Ordered in ED Medications  albuterol (PROVENTIL, VENTOLIN) (5 MG/ML) 0.5% continuous inhalation solution (3 mg/hr  Given 05/09/18 1127)  ipratropium (ATROVENT) 0.02 % nebulizer solution (0.5 mg  Given 05/09/18 1132)     Initial Impression / Assessment and Plan / ED Course  Triage vital signs and the nursing notes have been reviewed.  Pertinent labs & imaging results that were available during care of the patient were  reviewed and considered in medical decision making (see chart for details).  Patient presents to the SOB and is afebrile. Respiratory began an Albuterol treatment prior to the provider doing a respiratory exam. On evaluation, patient is breathing well and is able to speak normally in complete sentences. Patient is maintaining oxygen saturations well and > 98%. PERC score of 0. She also has upper respiratory complaints of congestion, rhinorrhea and sore throat which have been present for 2 days and is suggestive of viral URI.   Clinical Course as of May 10 1331  Wed May 09, 2018  1230 CXR normal.   [  GM]    Clinical Course User Index [GM] Manjit Bufano, Sharyon MedicusGabrielle I, PA-C    Final Clinical Impressions(s) / ED Diagnoses  1. Viral URI. Education provided on OTC and supportive treatment for symptom relief. Prescribed Albuterol inhaler.  Dispo: Home. After thorough clinical evaluation, this patient is determined to be medically stable and can be safely discharged with the previously mentioned treatment and/or outpatient follow-up/referral(s). At this time, there are no other apparent medical conditions that require further screening, evaluation or treatment.   Final diagnoses:  Viral URI with cough    ED Discharge Orders         Ordered    albuterol (PROVENTIL HFA;VENTOLIN HFA) 108 (90 Base) MCG/ACT inhaler  Every 6 hours PRN     05/09/18 [address]            Shantavia Jha, RalstonGabrielle I, PA-C 05/09/18 1332    Sabas SousBero, Michael M, MD 05/09/18 1601

## 2018-05-09 NOTE — Discharge Instructions (Signed)
Your chest x-ray was normal and there are no signs of pneumonia. With the congestion, cough and sore throat, you most likely have a viral upper respiratory infection. You can use over-the-counter products like Mucinex, Sudafed, Robitussion, Tylenol Cold/Flu, etc.  I have prescribed you your own Albuterol inhaler if you have subsequent episodes of shortness of breath. Please use this and not your kid's.

## 2018-05-09 NOTE — ED Triage Notes (Signed)
Pt reports SHOB since last night; used MDI multiple times w/o relief

## 2019-03-07 IMAGING — DX DG CHEST 2V
2 series · 2 of 2 positions shown · non-contrast
Comparison: 06/26/2011

CLINICAL DATA: Nonproductive cough, chest congestion, and shortness
of breath for 3 days.

EXAM:
CHEST - 2 VIEW

[chest pa]
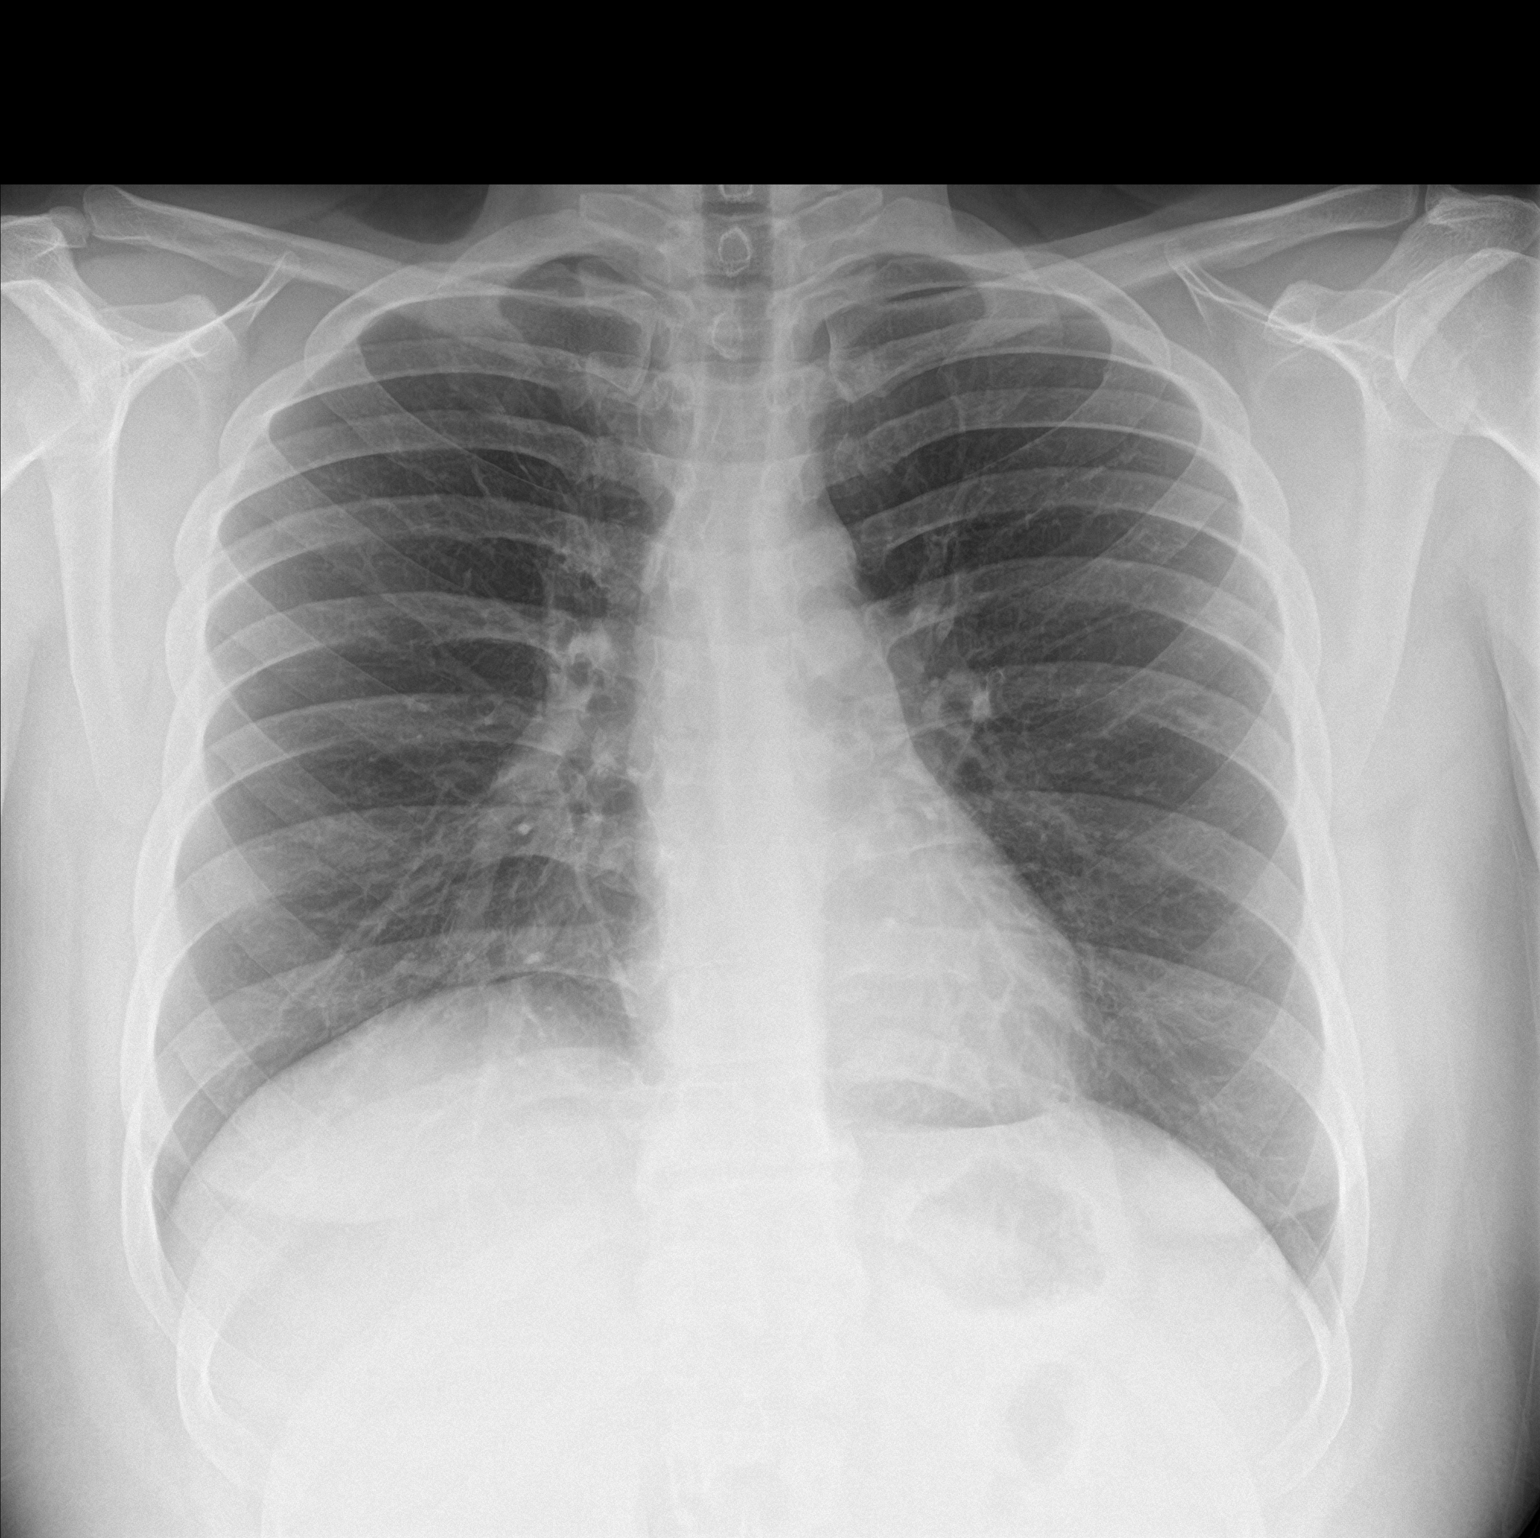

[chest lat]
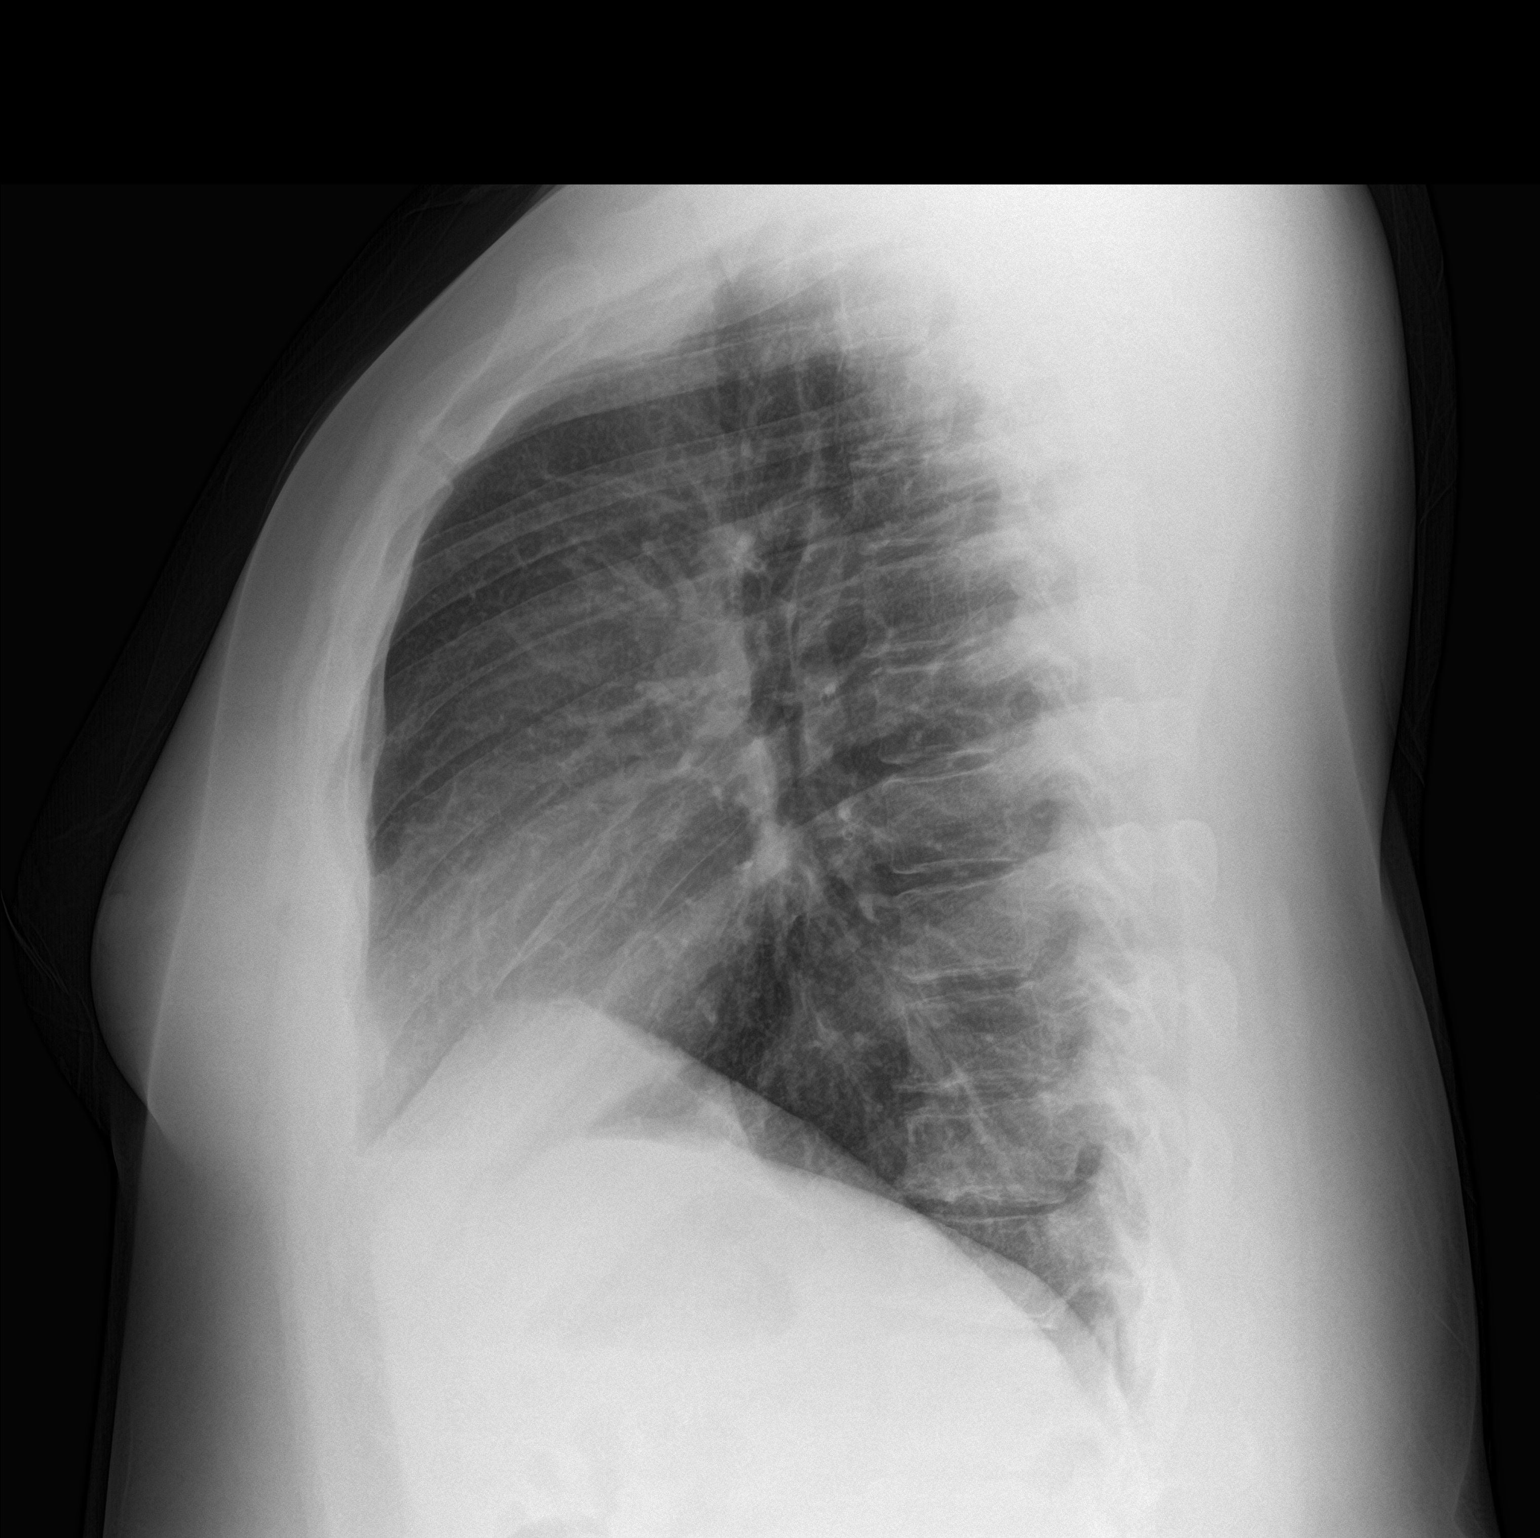

[2 of 2 positions shown; findings below may reference images not displayed]

FINDINGS: The heart size and mediastinal contours are within normal limits.
Both lungs are clear. The visualized skeletal structures are
unremarkable.
IMPRESSION: Normal exam.

## 2020-06-30 ENCOUNTER — Other Ambulatory Visit: Payer: Self-pay | Admitting: *Deleted

## 2020-06-30 DIAGNOSIS — I83891 Varicose veins of right lower extremities with other complications: Secondary | ICD-10-CM

## 2020-07-23 ENCOUNTER — Encounter: Payer: Medicaid Other | Admitting: Vascular Surgery

## 2020-07-23 ENCOUNTER — Encounter (HOSPITAL_COMMUNITY): Payer: Medicaid Other

## 2022-10-26 ENCOUNTER — Ambulatory Visit: Payer: Medicaid Other | Admitting: Internal Medicine

## 2022-11-14 ENCOUNTER — Ambulatory Visit: Payer: Medicaid Other | Admitting: Internal Medicine

## 2022-11-28 ENCOUNTER — Ambulatory Visit (INDEPENDENT_AMBULATORY_CARE_PROVIDER_SITE_OTHER): Payer: Medicaid Other | Admitting: Internal Medicine

## 2022-11-28 ENCOUNTER — Encounter: Payer: Self-pay | Admitting: Internal Medicine

## 2022-11-28 VITALS — BP 110/60 | HR 74 | Temp 97.9°F | Resp 18 | Ht 69.5 in | Wt 295.5 lb

## 2022-11-28 DIAGNOSIS — H1010 Acute atopic conjunctivitis, unspecified eye: Secondary | ICD-10-CM

## 2022-11-28 DIAGNOSIS — T781XXA Other adverse food reactions, not elsewhere classified, initial encounter: Secondary | ICD-10-CM

## 2022-11-28 DIAGNOSIS — J4540 Moderate persistent asthma, uncomplicated: Secondary | ICD-10-CM

## 2022-11-28 DIAGNOSIS — F17210 Nicotine dependence, cigarettes, uncomplicated: Secondary | ICD-10-CM

## 2022-11-28 DIAGNOSIS — J3089 Other allergic rhinitis: Secondary | ICD-10-CM | POA: Diagnosis not present

## 2022-11-28 DIAGNOSIS — H1013 Acute atopic conjunctivitis, bilateral: Secondary | ICD-10-CM

## 2022-11-28 DIAGNOSIS — L503 Dermatographic urticaria: Secondary | ICD-10-CM

## 2022-11-28 DIAGNOSIS — K9049 Malabsorption due to intolerance, not elsewhere classified: Secondary | ICD-10-CM

## 2022-11-28 DIAGNOSIS — J302 Other seasonal allergic rhinitis: Secondary | ICD-10-CM

## 2022-11-28 MED ORDER — FLUTICASONE PROPIONATE 50 MCG/ACT NA SUSP: 2.0000 | Freq: Every day | NASAL | 3 refills | Status: AC

## 2022-11-28 MED ORDER — BUDESONIDE-FORMOTEROL FUMARATE 80-4.5 MCG/ACT IN AERO: 2.0000 | INHALATION_SPRAY | Freq: Two times a day (BID) | RESPIRATORY_TRACT | 12 refills | Status: DC

## 2022-11-28 MED ORDER — CETIRIZINE HCL 10 MG PO TABS: 10.0000 mg | ORAL_TABLET | Freq: Every day | ORAL | 5 refills | Status: AC

## 2022-11-28 NOTE — Patient Instructions (Signed)
Moderate persistent asthma: Not well controlled  - your lung testing today looked okay  PLAN:  - Spacer sample and demonstration provided. - Controller Inhaler: Start Symbicort 64mg 2 puffs twice a day; This Should Be Used Everyday - Rinse mouth out after use - Rescue Inhaler: Albuterol (Proair/Ventolin) 2 puffs . Use  every 4-6 hours as needed for chest tightness, wheezing, or coughing.  Can also use 15 minutes prior to exercise if you have symptoms with activity. - Asthma is not controlled if:  - Symptoms are occurring >2 times a week OR  - >2 times a month nighttime awakenings  - You are requiring systemic steroids (prednisone/steroid injections) more than once per year  - Your require hospitalization for your asthma.  - Please call the clinic to schedule a follow up if these symptoms arise Continue to decrease smoking   Chronic Idiopathic Urticaria/ Dermatographism: - this is defined as hives lasting more than 6 weeks without an identifiable trigger - hives can be from a number of different sources including infections, allergies, vibration, temperature, pressure among many others other possible causes - often an identifiable cause is not determined - some potential triggers include: stress, illness, NSAIDs, aspirin, hormonal changes and scratching - approximately 50% of patients with chronic hives can have some associated swelling of the face/lips/eyelids (this is not a cause for alarm and does not typically progress onto systemic allergic reactions)  Therapy Plan:  - start zyrtec (cetirizine) '10mg'$  once daily - if hives are uncontrolled, increase zyrtec (cetirizine) to '10mg'$  twice daily - if hives remain uncontrolled, increase dose of zyrtec (cetirizine) to max dose of '20mg'$  (2 pills) twice daily- this is maximum dose - can increase or decrease dosing depending on symptom control to a maximum dose of 4 tablets of antihistamine daily. Wait until hives free for at least one month prior  to decreasing dose.   - if hives are still uncontrolled with the above regimen, please arrange an appointment for discussion of Xolair (omalizumab)- an injectable medication for hives  Can use one of the following in place of zyrtec if desires: Claritin (loratadine) 10 mg, Xyzal (levocetirizine) 5 mg or Allegra (fexofenadine) 180 mg daily as needed  Chronic Rhinitis Seasonal and Perennial Allergic: not well controlled - allergy testing today: Positive to grass, ragweed, tree, dust mite, cat, horse, dog  - Prevention:  - allergen avoidance when possible - consider allergy shots as long term control of your symptoms by teaching your immune system to be more tolerant of your allergy triggers  - Symptom control: - Start Nasal Steroid Spray: Best results if used daily. - Options include Flonase (fluticasone), Nasocort (triamcinolone), Nasonex (mometasome) 1- 2 sprays in each nostril daily.  - All can be purchased over-the-counter if not covered by insurance. - Use less frequently if airway gets too dry. - Start Singulair (Montelukast) '10mg'$  nightly.   - Discontinue if nightmares of behavior changes. - Start Antihistamine: daily or daily as needed.   -Options include Zyrtec (Cetirizine) '10mg'$ , Claritin (Loratadine) '10mg'$ , Allegra (Fexofenadine) '180mg'$ , or Xyzal (Levocetirinze) '5mg'$  - Can be purchased over-the-counter if not covered by insurance.  Allergic Conjunctivitis:  - Start Allergy Eye drops-great options include Pataday (Olopatadine) or Zaditor (ketotifen) for eye symptoms daily as needed-both sold over the counter if not covered by insurance. and Rewetting Drops such as Systane,TheraTears, etc  -Avoid eye drops that say red eye relief as they may contain medications that dry out your eyes.    Oral Allergy Syndrome: bananas  -  Your symptoms are not consistent with true food allergies, and are more likely to be due to oral allergy syndrome. - These symptoms are not life-threatening and are  because of a cross reaction between a pollen you are allergic to, and to a protein in specific foods (such as fresh fruits, vegetables, and nuts). - If you can eat these things it is fine to continue to do so.  If not, you may avoid these fresh fruits.  Heating these foods should allow them to be consumed without symptoms. - You may notice increase in symptoms during allergy season, this is to be expected. - Allergy  Immunotherapy can help lessen and some cases cure these symptoms and should be considered if they worsen.   Follow up:   Thank you so much for letting me partake in your care today.  Don't hesitate to reach out if you have any additional concerns!  Roney Marion, MD  [facility] [place] [place]  Reducing Pollen Exposure  The American Academy of Allergy, Asthma and Immunology suggests the following steps to reduce your exposure to pollen during allergy seasons.    Do not hang sheets or clothing out to dry; pollen may collect on these items. Do not mow lawns or spend time around freshly cut grass; mowing stirs up pollen. Keep windows closed at night.  Keep car windows closed while driving. Minimize morning activities outdoors, a time when pollen counts are usually at their highest. Stay indoors as much as possible when pollen counts or humidity is high and on windy days when pollen tends to remain in the air longer. Use air conditioning when possible.  Many air conditioners have filters that trap the pollen spores. Use a HEPA room air filter to remove pollen form the indoor air you breathe.  DUST MITE AVOIDANCE MEASURES:  There are three main measures that need and can be taken to avoid house dust mites:  Reduce accumulation of dust in general -reduce furniture, clothing, carpeting, books, stuffed animals, especially in bedroom  Separate yourself from the dust -use pillow and mattress encasements (can be found at stores such as [employer] or  online) -avoid direct exposure to air condition flow -use a HEPA filter device, especially in the bedroom; you can also use a HEPA filter vacuum cleaner -wipe dust with a moist towel instead of a dry towel or broom when cleaning  Decrease mites and/or their secretions -wash clothing and linen and stuffed animals at highest temperature possible, at least every 2 weeks -stuffed animals can also be placed in a bag and put in a freezer overnight  Despite the above measures, it is impossible to eliminate dust mites or their allergen completely from your home.  With the above measures the burden of mites in your home can be diminished, with the goal of minimizing your allergic symptoms.  Success will be reached only when implementing and using all means together.  Control of Dog or Cat Allergen  Avoidance is the best way to manage a dog or cat allergy. If you have a dog or cat and are allergic to dog or cats, consider removing the dog or cat from the home. If you have a dog or cat but don't want to find it a new home, or if your family wants a pet even though someone in the household is allergic, here are some strategies that may help keep symptoms at bay:  Keep the pet out of your bedroom and  restrict it to only a few rooms. Be advised that keeping the dog or cat in only one room will not limit the allergens to that room. Don't pet, hug or kiss the dog or cat; if you do, wash your hands with soap and water. High-efficiency particulate air (HEPA) cleaners run continuously in a bedroom or living room can reduce allergen levels over time. Regular use of a high-efficiency vacuum cleaner or a central vacuum can reduce allergen levels. Giving your dog or cat a bath at least once a week can reduce airborne allergen.

## 2022-11-28 NOTE — Progress Notes (Signed)
New Patient Note  RE: Kelsey Hartman MRN: [id number] DOB: Feb 09, 1986 Date of Office Visit: 11/28/2022  Consult requested by: Glory Buff, MD Primary care provider: Glory Buff, MD  Chief Complaint: Asthma  History of Present Illness: I had the pleasure of seeing Kelsey Hartman for initial evaluation at the [facility] on 11/28/2022. She is a 37 y.o. female, who is referred here by Glory Buff, MD for the evaluation of asthma.  History obtained from patient, chart review.  Asthma History:  -Diagnosed at age as a young child .  -Current symptoms include chest tightness, cough, shortness of breath, and wheezing Multiple times a day daytime symptoms in past month, 0 nighttime awakenings in past month Using rescue inhaler 2-5 times a day  -Limitations to daily activity: some - 0 ED visits, 0 UC visits and  oral steroids in the past year - 1 number of lifetime hospitalizations, 0 number of lifetime intubations.  - Identified Triggers:  anxiety, humidity, exercise, respiratory illness, and smoke exposure, animals - Up-to-date with pneumonia, vaccines. - History of prior pneumonias: denies  - History of prior COVID-19/FLU/RSV infection: Influenza as a child,  - Smoking exposure: active smoker (7 cigerattes a day) Previous Diagnostics:  - Prior PFTs or spirometry: none to review - Most Recent AEC (01/31/22): 200 -Most Recent Chest Imaging: PA/LAT on (01/31/22): Heart size and mediastinal contours are within normal limits. No  pleural effusion or edema identified. No airspace opacities. Mild  degenerative changes noted within the lower thoracic spine.  - Today's Asthma Control Test:  . 12/25 Management:  - Previously used therapies: ventolin.  - Current regimen:  - Maintenance: none - Rescue: Albuterol 2 puffs q4-6 hrs PRN, not using prior to exercise  Chronic rhinitis: started a few years ago  Symptoms include:  cough, wheezing , nasal congestion, rhinorrhea,  post nasal drainage, sneezing, watery eyes, itchy eyes, and itchy nose  Occurs year-round with seasonal flares Potential triggers: animals, pollen season  Treatments tried: Claritin as needed   Previous allergy testing: no History of reflux/heartburn: no History of chronic sinusitis or sinus surgery: no Nonallergic triggers:  denies         Assessment and Plan: [name] is a 37 y.o. female with: Not well controlled moderate persistent asthma - Plan: Spirometry with Graph  Dermatographic urticaria  Seasonal and perennial allergic rhinitis - Plan: Allergy Test, Interdermal Allergy Test  Seasonal allergic conjunctivitis  Smoking greater than 10 pack years  Pollen-food allergy, initial encounter   Plan: Patient Instructions  Moderate persistent asthma: Not well controlled  - your lung testing today looked okay  PLAN:  - Spacer sample and demonstration provided. - Controller Inhaler: Start Symbicort 69mg 2 puffs twice a day; This Should Be Used Everyday - Rinse mouth out after use - Rescue Inhaler: Albuterol (Proair/Ventolin) 2 puffs . Use  every 4-6 hours as needed for chest tightness, wheezing, or coughing.  Can also use 15 minutes prior to exercise if you have symptoms with activity. - Asthma is not controlled if:  - Symptoms are occurring >2 times a week OR  - >2 times a month nighttime awakenings  - You are requiring systemic steroids (prednisone/steroid injections) more than once per year  - Your require hospitalization for your asthma.  - Please call the clinic to schedule a follow up if these symptoms arise Continue to decrease smoking   Chronic Idiopathic Urticaria/ Dermatographism: - this is defined as hives lasting more than  6 weeks without an identifiable trigger - hives can be from a number of different sources including infections, allergies, vibration, temperature, pressure among many others other possible causes - often an identifiable cause is not  determined - some potential triggers include: stress, illness, NSAIDs, aspirin, hormonal changes and scratching - approximately 50% of patients with chronic hives can have some associated swelling of the face/lips/eyelids (this is not a cause for alarm and does not typically progress onto systemic allergic reactions)  Therapy Plan:  - start zyrtec (cetirizine) '10mg'$  once daily - if hives are uncontrolled, increase zyrtec (cetirizine) to '10mg'$  twice daily - if hives remain uncontrolled, increase dose of zyrtec (cetirizine) to max dose of '20mg'$  (2 pills) twice daily- this is maximum dose - can increase or decrease dosing depending on symptom control to a maximum dose of 4 tablets of antihistamine daily. Wait until hives free for at least one month prior to decreasing dose.   - if hives are still uncontrolled with the above regimen, please arrange an appointment for discussion of Xolair (omalizumab)- an injectable medication for hives  Can use one of the following in place of zyrtec if desires: Claritin (loratadine) 10 mg, Xyzal (levocetirizine) 5 mg or Allegra (fexofenadine) 180 mg daily as needed  Chronic Rhinitis Seasonal and Perennial Allergic: not well controlled - allergy testing today: Positive to grass, ragweed, tree, dust mite, cat, horse, dog  - Prevention:  - allergen avoidance when possible - consider allergy shots as long term control of your symptoms by teaching your immune system to be more tolerant of your allergy triggers  - Symptom control: - Start Nasal Steroid Spray: Best results if used daily. - Options include Flonase (fluticasone), Nasocort (triamcinolone), Nasonex (mometasome) 1- 2 sprays in each nostril daily.  - All can be purchased over-the-counter if not covered by insurance. - Use less frequently if airway gets too dry. - Start Singulair (Montelukast) '10mg'$  nightly.   - Discontinue if nightmares of behavior changes. - Start Antihistamine: daily or daily as needed.    -Options include Zyrtec (Cetirizine) '10mg'$ , Claritin (Loratadine) '10mg'$ , Allegra (Fexofenadine) '180mg'$ , or Xyzal (Levocetirinze) '5mg'$  - Can be purchased over-the-counter if not covered by insurance.  Allergic Conjunctivitis:  - Start Allergy Eye drops-great options include Pataday (Olopatadine) or Zaditor (ketotifen) for eye symptoms daily as needed-both sold over the counter if not covered by insurance. and Rewetting Drops such as Systane,TheraTears, etc  -Avoid eye drops that say red eye relief as they may contain medications that dry out your eyes.    Oral Allergy Syndrome: bananas  - Your symptoms are not consistent with true food allergies, and are more likely to be due to oral allergy syndrome. - These symptoms are not life-threatening and are because of a cross reaction between a pollen you are allergic to, and to a protein in specific foods (such as fresh fruits, vegetables, and nuts). - If you can eat these things it is fine to continue to do so.  If not, you may avoid these fresh fruits.  Heating these foods should allow them to be consumed without symptoms. - You may notice increase in symptoms during allergy season, this is to be expected. - Allergy  Immunotherapy can help lessen and some cases cure these symptoms and should be considered if they worsen.   Follow up:   Thank you so much for letting me partake in your care today.  Don't hesitate to reach out if you have any additional concerns!  Roney Marion, MD  [facility] [place]  Reducing Pollen Exposure  The American Academy of Allergy, Asthma and Immunology suggests the following steps to reduce your exposure to pollen during allergy seasons.    Do not hang sheets or clothing out to dry; pollen may collect on these items. Do not mow lawns or spend time around freshly cut grass; mowing stirs up pollen. Keep windows closed at night.  Keep car windows closed while driving. Minimize morning  activities outdoors, a time when pollen counts are usually at their highest. Stay indoors as much as possible when pollen counts or humidity is high and on windy days when pollen tends to remain in the air longer. Use air conditioning when possible.  Many air conditioners have filters that trap the pollen spores. Use a HEPA room air filter to remove pollen form the indoor air you breathe.  DUST MITE AVOIDANCE MEASURES:  There are three main measures that need and can be taken to avoid house dust mites:  Reduce accumulation of dust in general -reduce furniture, clothing, carpeting, books, stuffed animals, especially in bedroom  Separate yourself from the dust -use pillow and mattress encasements (can be found at stores such as [employer] or online) -avoid direct exposure to air condition flow -use a HEPA filter device, especially in the bedroom; you can also use a HEPA filter vacuum cleaner -wipe dust with a moist towel instead of a dry towel or broom when cleaning  Decrease mites and/or their secretions -wash clothing and linen and stuffed animals at highest temperature possible, at least every 2 weeks -stuffed animals can also be placed in a bag and put in a freezer overnight  Despite the above measures, it is impossible to eliminate dust mites or their allergen completely from your home.  With the above measures the burden of mites in your home can be diminished, with the goal of minimizing your allergic symptoms.  Success will be reached only when implementing and using all means together.  Control of Dog or Cat Allergen  Avoidance is the best way to manage a dog or cat allergy. If you have a dog or cat and are allergic to dog or cats, consider removing the dog or cat from the home. If you have a dog or cat but don't want to find it a new home, or if your family wants a pet even though someone in the household is allergic, here are some strategies that may help keep symptoms  at bay:  Keep the pet out of your bedroom and restrict it to only a few rooms. Be advised that keeping the dog or cat in only one room will not limit the allergens to that room. Don't pet, hug or kiss the dog or cat; if you do, wash your hands with soap and water. High-efficiency particulate air (HEPA) cleaners run continuously in a bedroom or living room can reduce allergen levels over time. Regular use of a high-efficiency vacuum cleaner or a central vacuum can reduce allergen levels. Giving your dog or cat a bath at least once a week can reduce airborne allergen.    Meds ordered this encounter  Medications   budesonide-formoterol (SYMBICORT) 80-4.5 MCG/ACT inhaler    Sig: Inhale 2 puffs into the lungs in the morning and at bedtime.    Dispense:  1 each    Refill:  12   fluticasone (FLONASE) 50 MCG/ACT nasal spray    Sig: Place 2 sprays into both nostrils daily.  Dispense:  16 g    Refill:  3   cetirizine (ZYRTEC) 10 MG tablet    Sig: Take 1 tablet (10 mg total) by mouth daily.    Dispense:  30 tablet    Refill:  5   Lab Orders  No laboratory test(s) ordered today    Other allergy screening: Asthma: yes Rhino conjunctivitis: yes Food allergy:  Bananas causes throat itching  Medication allergy: no Hymenoptera allergy: no Urticaria:  dermatographsim, has not tried preventative antihistmaines  Eczema:no History of recurrent infections suggestive of immunodeficency: no  Diagnostics: Spirometry:  Tracings reviewed. Her effort: Good reproducible efforts. FVC: 3.81L FEV1: 3.00L, 79% predicted FEV1/FVC ratio: 79% Interpretation: Spirometry consistent with normal pattern.  After 4 puffs of albuterol there was no significant post bronchodilator response   Please see scanned spirometry results for details.  Skin Testing: Environmental allergy panel and select foods.  Epicutaneous Testing: grass, ragweed, dust mite, cat, horse   Intradermal Testing: dog, johnson, tree mix   adequate controls  Results interpreted by myself and discussed with patient/family.  Airborne Adult Perc - 11/28/22 0954     Time Antigen Placed 0950    Allergen Manufacturer Lavella Hammock    Location Back    Number of Test 59    1. Control-Buffer 50% Glycerol Negative    2. Control-Histamine 1 mg/ml 4+    3. Albumin saline Negative    4. [place] 4+    5. [place] 3+    6. Johnson Negative    7. [place] Blue 3+    8. Meadow Fescue Negative    9. Perennial Rye 4+    10. Sweet Vernal 3+    11. Timothy 4+    12. Cocklebur Negative    13. Burweed Marshelder Negative    14. Ragweed, short 4+    15. Ragweed, Giant Negative    16. Plantain,  English Negative    17. Lamb's Quarters Negative    18. Sheep Sorrell Negative    19. Rough Pigweed Negative    20. Marsh Elder, Rough Negative    21. Mugwort, Common Negative    22. Ash mix Negative    23. Birch mix Negative    24. Beech American Negative    25. Box, Elder Negative    26. Cedar, red Negative    27. Cottonwood, [place] Negative    28. Elm mix Negative    29. Hickory Negative    30. Maple mix Negative    31. Oak, [place] mix Negative    32. Pecan Pollen Negative    33. Pine mix Negative    34. Sycamore Eastern Negative    35. [place], Black Pollen Negative    36. Alternaria alternata Negative    37. Cladosporium Herbarum Negative    38. Aspergillus mix Negative    39. Penicillium mix Negative    40. Bipolaris sorokiniana (Helminthosporium) Negative    41. Drechslera spicifera (Curvularia) Negative    42. Mucor plumbeus Negative    43. Fusarium moniliforme Negative    44. Aureobasidium pullulans (pullulara) Negative    45. Rhizopus oryzae Negative    46. Botrytis cinera Negative    47. Epicoccum nigrum Negative    48. Phoma betae Negative    49. Candida Albicans Negative    50. Trichophyton mentagrophytes Negative    51. Mite, D Farinae  5,000 AU/ml Negative    52. Mite, D Pteronyssinus  5,000 AU/ml 4+    53. Cat Hair  10,000 BAU/ml  3+    54.  Dog Epithelia Negative    55. Mixed Feathers Negative    56. Horse Epithelia 3+    57. Cockroach, German Negative    58. Mouse Negative    59. Tobacco Leaf Negative             Food Perc - 11/28/22 0954       Test Information   Time Antigen Placed [id number]    Allergen Manufacturer Lavella Hammock    Location Back    Number of allergen test 10      Food   1. Peanut Negative    2. Soybean food Negative    3. Wheat, whole Negative    4. Sesame Negative    5. Milk, cow Negative    6. Egg White, chicken Negative    7. Casein Negative    8. Shellfish mix Negative    9. Fish mix Negative    10. Cashew Negative             Intradermal - 11/28/22 1026     Time Antigen Placed 1025    Allergen Manufacturer Greer    Location Arm    Number of Test 10    Control Negative    Johnson 4+    Weed mix Negative    Tree mix 4+    Mold 1 Negative    Mold 2 Negative    Mold 3 Negative    Mold 4 Negative    Dog 4+    Cockroach Negative             Past Medical History: Patient Active Problem List   Diagnosis Date Noted   Varicose veins of right lower extremity with complications [id number]   Past Medical History:  Diagnosis Date   Asthma    Varicose veins    Past Surgical History: Past Surgical History:  Procedure Laterality Date   BACK SURGERY     ENDOVENOUS ABLATION SAPHENOUS VEIN W/ LASER Right 05/30/2017   endovenous laser ablation right greater saphenous vein and stab phlebectomy > 20 incisions right leg by Dr. Kellie Simmering    uterine surergy at age 53     Medication List:  Current Outpatient Medications  Medication Sig Dispense Refill   albuterol (PROVENTIL HFA;VENTOLIN HFA) 108 (90 Base) MCG/ACT inhaler Inhale 1-2 puffs into the lungs every 6 (six) hours as needed for wheezing or shortness of breath. 1 Inhaler 1   budesonide-formoterol (SYMBICORT) 80-4.5 MCG/ACT inhaler Inhale 2 puffs into the lungs in the morning and at bedtime. 1 each 12    cetirizine (ZYRTEC) 10 MG tablet Take 1 tablet (10 mg total) by mouth daily. 30 tablet 5   fluticasone (FLONASE) 50 MCG/ACT nasal spray Place 2 sprays into both nostrils daily. 16 g 3   No current facility-administered medications for this visit.   Allergies: No Known Allergies Social History: Social History   Socioeconomic History   Marital status: Single    Spouse name: Not on file   Number of children: Not on file   Years of education: Not on file   Highest education level: Not on file  Occupational History   Not on file  Tobacco Use   Smoking status: Every Day    Packs/day: 0.25    Years: 7.00    Total pack years: 1.75    Types: Cigarettes    Passive exposure: Current   Smokeless tobacco: Never  Vaping Use   Vaping Use: Never used  Substance and  Sexual Activity   Alcohol use: No   Drug use: No   Sexual activity: Not on file  Other Topics Concern   Not on file  Social History Narrative   Not on file   Social Determinants of Health   Financial Resource Strain: Not on file  Food Insecurity: Not on file  Transportation Needs: Not on file  Physical Activity: Not on file  Stress: Not on file  Social Connections: Not on file   Lives in a Apt, no raches in the house and bed is 2 ft off the floor,no DM precautions, . 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   Objective: BP 110/60   Pulse 74   Temp 97.9 F (36.6 C) (Temporal)   Resp 18   Ht 5' 9.5" (1.765 m)   Wt 295 lb 8  oz (134 kg)   SpO2 99%   BMI 43.01 kg/m  Body mass index is 43.01 kg/m.  General Appearance:  Alert, cooperative, no distress, appears stated age  Head:  Normocephalic, without obvious abnormality, atraumatic  Eyes:  Conjunctiva clear, EOM's intact  Nose: Nares normal,  erythematous and edematous nasal mucosa, clear rhinorrhea, hypertrophic turbinates, no visible anterior polyps, and septum midline  Throat: Lips, tongue normal; teeth and gums normal, tonsils 2+ and + cobblestoning  Neck: Supple, symmetrical  Lungs:   clear to auscultation bilaterally, Respirations unlabored, no coughing  Heart:  regular rate and rhythm and no murmur, Appears well perfused  Extremities: No edema  Skin: Skin color, texture, turgor normal, no rashes or lesions on visualized portions of skin  Neurologic: No gross deficits   The plan was reviewed with the patient/family, and all questions/concerned were addressed.  It was my pleasure to see Lidiana today and participate in her care. Please feel free to contact me with any questions or concerns.  Sincerely,  Roney Marion, MD Allergy & Immunology  [facility] office: [phone] [facility] office: [phone]

## 2022-12-26 ENCOUNTER — Ambulatory Visit: Payer: Medicaid Other | Admitting: Internal Medicine

## 2023-06-25 ENCOUNTER — Other Ambulatory Visit: Payer: Self-pay | Admitting: Internal Medicine

## 2023-07-10 ENCOUNTER — Other Ambulatory Visit: Payer: Self-pay | Admitting: Internal Medicine

## 2023-10-31 ENCOUNTER — Other Ambulatory Visit: Payer: Self-pay | Admitting: Internal Medicine

## 2023-11-29 ENCOUNTER — Other Ambulatory Visit: Payer: Self-pay | Admitting: Internal Medicine
